# Patient Record
Sex: Female | Born: 1960 | Race: White | Hispanic: No | Marital: Married | Smoking: Former smoker
Health system: Southern US, Community
[De-identification: ages and names within clinical notes are randomized; demographics above are authoritative.]

## PROBLEM LIST (undated history)

## (undated) DIAGNOSIS — R011 Cardiac murmur, unspecified: Secondary | ICD-10-CM

## (undated) DIAGNOSIS — R51 Headache: Secondary | ICD-10-CM

## (undated) DIAGNOSIS — H3530 Unspecified macular degeneration: Secondary | ICD-10-CM

## (undated) DIAGNOSIS — I10 Essential (primary) hypertension: Secondary | ICD-10-CM

## (undated) DIAGNOSIS — R519 Headache, unspecified: Secondary | ICD-10-CM

## (undated) DIAGNOSIS — F419 Anxiety disorder, unspecified: Secondary | ICD-10-CM

## (undated) DIAGNOSIS — E119 Type 2 diabetes mellitus without complications: Secondary | ICD-10-CM

## (undated) HISTORY — DX: Unspecified macular degeneration: H35.30

## (undated) HISTORY — PX: OOPHORECTOMY: SHX86

---

## 2008-08-10 HISTORY — PX: ABDOMINAL HYSTERECTOMY: SHX81

## 2008-10-24 ENCOUNTER — Ambulatory Visit: Payer: Self-pay | Admitting: Family Medicine

## 2008-12-21 ENCOUNTER — Ambulatory Visit: Payer: Self-pay | Admitting: Cardiology

## 2008-12-21 ENCOUNTER — Ambulatory Visit: Payer: Self-pay | Admitting: Obstetrics & Gynecology

## 2008-12-27 ENCOUNTER — Ambulatory Visit: Payer: Self-pay | Admitting: Obstetrics & Gynecology

## 2008-12-29 ENCOUNTER — Ambulatory Visit: Payer: Self-pay | Admitting: Internal Medicine

## 2009-01-08 ENCOUNTER — Ambulatory Visit: Payer: Self-pay | Admitting: Gynecologic Oncology

## 2009-01-22 ENCOUNTER — Ambulatory Visit: Payer: Self-pay | Admitting: Gynecologic Oncology

## 2012-12-02 ENCOUNTER — Ambulatory Visit: Payer: Self-pay | Admitting: Family Medicine

## 2016-06-03 ENCOUNTER — Encounter (INDEPENDENT_AMBULATORY_CARE_PROVIDER_SITE_OTHER): Payer: Self-pay

## 2016-06-03 ENCOUNTER — Ambulatory Visit: Payer: Self-pay | Attending: Oncology

## 2016-06-03 ENCOUNTER — Ambulatory Visit
Admission: RE | Admit: 2016-06-03 | Discharge: 2016-06-03 | Disposition: A | Discharge status: Home / Self Care | Payer: Self-pay | Source: Ambulatory Visit | Attending: Oncology | Admitting: Oncology

## 2016-06-03 VITALS — BP 188/100 | HR 67 | Temp 98.1°F | Ht 67.91 in | Wt 227.0 lb

## 2016-06-03 DIAGNOSIS — Z0000 Encounter for general adult medical examination without abnormal findings: Secondary | ICD-10-CM

## 2016-06-03 NOTE — Progress Notes (Signed)
Subjective:     Patient ID: Diane Logan, female   DOB: Mar 12, 1961, 55 y.o.   MRN: [id number]  HPI   Review of Systems     Objective:   Physical Exam  Pulmonary/Chest: Right breast exhibits no inverted nipple, no mass, no nipple discharge, no skin change and no tenderness. Left breast exhibits no inverted nipple, no mass, no nipple discharge, no skin change and no tenderness. Breasts are symmetrical.       Assessment:  55 year old patient presents for Diane Logan clinic visit.  Patient screened, and meets BCCCP eligibility.  Patient does not have insurance, Medicare or Medicaid.  Handout given on Affordable Care Act.  Instructed patient on breast self-exam using teach back method.  CBE unremarkable.  No mass or lump palpated.  Patient reports she had a "complete hysterectomy".  States she gets her blood pressure medication from Diane Logan, and has no problem paying for it, just forgot.     Plan:     Sent for bilateral screening mammogram.

## 2016-06-04 NOTE — Progress Notes (Signed)
Letter mailed from [facility] to notify of normal mammogram results.  Patient to return in one year for annual screening.  Copy to [facility]. 

## 2016-12-04 DIAGNOSIS — N3281 Overactive bladder: Secondary | ICD-10-CM | POA: Insufficient documentation

## 2016-12-14 DIAGNOSIS — R0683 Snoring: Secondary | ICD-10-CM | POA: Insufficient documentation

## 2017-07-17 ENCOUNTER — Other Ambulatory Visit: Payer: Self-pay

## 2017-07-17 ENCOUNTER — Ambulatory Visit
Admission: EM | Admit: 2017-07-17 | Discharge: 2017-07-17 | Disposition: A | Discharge status: Home / Self Care | Payer: 59 | Attending: Emergency Medicine | Admitting: Emergency Medicine

## 2017-07-17 DIAGNOSIS — R059 Cough, unspecified: Secondary | ICD-10-CM

## 2017-07-17 DIAGNOSIS — J019 Acute sinusitis, unspecified: Secondary | ICD-10-CM

## 2017-07-17 DIAGNOSIS — R05 Cough: Secondary | ICD-10-CM

## 2017-07-17 HISTORY — DX: Essential (primary) hypertension: I10

## 2017-07-17 HISTORY — DX: Type 2 diabetes mellitus without complications: E11.9

## 2017-07-17 HISTORY — DX: Anxiety disorder, unspecified: F41.9

## 2017-07-17 MED ORDER — IPRATROPIUM BROMIDE 0.06 % NA SOLN: 2.0000 | Freq: Four times a day (QID) | NASAL | 0 refills | Status: DC

## 2017-07-17 MED ORDER — HYDROCOD POLST-CPM POLST ER 10-8 MG/5ML PO SUER: 5.0000 mL | Freq: Two times a day (BID) | ORAL | 0 refills | Status: DC | PRN

## 2017-07-17 MED ORDER — FLUTICASONE PROPIONATE 50 MCG/ACT NA SUSP: 2.0000 | Freq: Every day | NASAL | 0 refills | Status: DC

## 2017-07-17 MED ORDER — BENZONATATE 200 MG PO CAPS: 200.0000 mg | ORAL_CAPSULE | Freq: Three times a day (TID) | ORAL | 0 refills | Status: DC | PRN

## 2017-07-17 NOTE — ED Triage Notes (Signed)
Pt with cough and congestion x past 3 days. Unsure if she's had fever. Headache pain "from coughing so much."

## 2017-07-17 NOTE — ED Provider Notes (Signed)
HPI  SUBJECTIVE:  Diane Logan is a 56 y.o. female who presents with congestion, rhinorrhea, postnasal drip for the past several days.  She reports a dry, nonproductive cough and 3 episodes of posttussive emesis.  Reports headache secondary to the cough.  She states that both her ears feel stopped up.  She denies change in her hearing or ear pain.  States that she cannot sleep at night secondary to the cough.  She denies sinus pain or pressure, fevers, upper dental pain.  No facial swelling.  No wheezing, chest pain, shortness of breath.  No acid reflux symptoms.  She has allergies, but states that they are not bothering her now.  No sick contacts.  No antibiotics in the past month.  She took Excedrin within 6-8 hours of valuation.  She has also tried Mucinex with improvement in her symptoms.  Symptoms worse with coughing.  She has a past medical history of allergies, diabetes, hypertension, sinusitis which frequently causes a cough.  No history of asthma, emphysema, COPD, smoking, GERD.  [id number], Edmonia Lynch, MD .  Past Medical History:  Diagnosis Date  . Anxiety   . Diabetes mellitus without complication ([place])   . Hypertension     Past Surgical History:  Procedure Laterality Date  . ABDOMINAL HYSTERECTOMY      Family History  Adopted: Yes    Social History   Tobacco Use  . Smoking status: Former Research scientist (life sciences)  . Smokeless tobacco: Never Used  Substance Use Topics  . Alcohol use: Yes    Comment: monthly  . Drug use: No    No current facility-administered medications for this encounter.   Current Outpatient Medications:  .  lisinopril (PRINIVIL,ZESTRIL) 10 MG tablet, Take 10 mg by mouth daily., Disp: , Rfl:  .  metFORMIN (GLUCOPHAGE) 1000 MG tablet, Take 1,000 mg by mouth 2 (two) times daily with a meal., Disp: , Rfl:  .  PARoxetine (PAXIL) 40 MG tablet, Take 40 mg by mouth every morning., Disp: , Rfl:  .  benzonatate (TESSALON) 200 MG capsule, Take 1 capsule (200 mg total) by mouth 3  (three) times daily as needed for cough., Disp: 30 capsule, Rfl: 0 .  chlorpheniramine-HYDROcodone (TUSSIONEX PENNKINETIC ER) 10-8 MG/5ML SUER, Take 5 mLs by mouth every 12 (twelve) hours as needed for cough., Disp: 120 mL, Rfl: 0 .  fluticasone (FLONASE) 50 MCG/ACT nasal spray, Place 2 sprays into both nostrils daily., Disp: 16 g, Rfl: 0 .  ipratropium (ATROVENT) 0.06 % nasal spray, Place 2 sprays into both nostrils 4 (four) times daily. 3-4 times/ day, Disp: 15 mL, Rfl: 0  No Known Allergies   ROS  As noted in HPI.   Physical Exam  BP (!) 136/91 (BP Location: Left Arm)   Pulse 80   Temp 97.9 F (36.6 C)   Resp 18   Ht 5' 6.5" (1.689 m)   Wt 217 lb (98.4 kg)   LMP 06/03/2009 (Approximate)   SpO2 100%   BMI 34.50 kg/m   Constitutional: Well developed, well nourished, no acute distress Eyes:  EOMI, conjunctiva normal bilaterally HENT: Normocephalic, atraumatic,mucus membranes moist.  Right TM normal.  Left TM obscured with cerumen.  Positive purulent mucoid nasal congestion no maxillary or frontal sinus tenderness.  Positive postnasal drip.   Respiratory: Normal inspiratory effort, lungs clear bilaterally, good air movement Cardiovascular: Normal rate and rhythm no murmurs rubs or gallops  GI: nondistended skin: No rash, skin intact Musculoskeletal: no deformities Neurologic: Alert & oriented x 3,  no focal neuro deficits Psychiatric: Speech and behavior appropriate   ED Course   Medications - No data to display  No orders of the defined types were placed in this encounter.   No results found for this or any previous visit (from the past 24 hour(s)). No results found.  ED Clinical Impression  Cough  Acute sinusitis, recurrence not specified, unspecified location   ED Assessment/Plan  [place] Narcotic database reviewed for this patient, and feel that the risk/benefit ratio today is favorable for proceeding with a prescription for controlled substance.  1 cough syrup  prescription in 2017.  Presentation consistent with an acute sinusitis, most likely viral,, feel that the cough is from postnasal drip.  Doubt pneumonia.  She has no clear indications for antibiotics at this time, and patient is okay with not starting antibiotics today.  We will send home with Tussionex, Tessalon.  She will start Flonase or Atrovent, which ever one works better, she will continue Mucinex and start some saline nasal irrigation.  Follow-up with PMD or here if not better after 10 days of being sick, double sickening, getting worse.  Discussed MDM, plan and followup with patient.patient agrees with plan.   Meds ordered this encounter  Medications  . ipratropium (ATROVENT) 0.06 % nasal spray    Sig: Place 2 sprays into both nostrils 4 (four) times daily. 3-4 times/ day    Dispense:  15 mL    Refill:  0  . fluticasone (FLONASE) 50 MCG/ACT nasal spray    Sig: Place 2 sprays into both nostrils daily.    Dispense:  16 g    Refill:  0  . chlorpheniramine-HYDROcodone (TUSSIONEX PENNKINETIC ER) 10-8 MG/5ML SUER    Sig: Take 5 mLs by mouth every 12 (twelve) hours as needed for cough.    Dispense:  120 mL    Refill:  0  . benzonatate (TESSALON) 200 MG capsule    Sig: Take 1 capsule (200 mg total) by mouth 3 (three) times daily as needed for cough.    Dispense:  30 capsule    Refill:  0    *This clinic note was created using [employer]. Therefore, there may be occasional mistakes despite careful proofreading.   ?   Melynda Ripple, MD 07/17/17 [phone]

## 2017-07-17 NOTE — Discharge Instructions (Signed)
Take the medication as written. Return to the ED or here if you get worse, have a fever >100.4, or for any concerns. You may take 600 mg of motrin with 1 gram of tylenol up to 3-4 times a day as needed for pain. This is an effective combination for pain.  Most sinus infections are viral and do not need antibiotics unless you have a high fever, have had this for 10 days, or you get better and then get sick again. Use a neti pot or the NeilMed sinus rinse as often as you want to to reduce nasal congestion. Follow the directions on the box.   Go to www.goodrx.com to look up your medications. This will give you a list of where you can find your prescriptions at the most affordable prices. Or you can ask the pharmacist what the cash price is. This is frequently cheaper than going through insurance.

## 2017-07-23 ENCOUNTER — Encounter: Payer: Self-pay | Admitting: *Deleted

## 2017-09-17 ENCOUNTER — Other Ambulatory Visit: Payer: Self-pay | Admitting: Podiatry

## 2017-09-17 ENCOUNTER — Encounter: Payer: Self-pay | Admitting: Podiatry

## 2017-09-17 ENCOUNTER — Other Ambulatory Visit: Payer: Self-pay

## 2017-09-17 ENCOUNTER — Ambulatory Visit (INDEPENDENT_AMBULATORY_CARE_PROVIDER_SITE_OTHER): Payer: 59

## 2017-09-17 ENCOUNTER — Ambulatory Visit: Payer: 59 | Admitting: Podiatry

## 2017-09-17 DIAGNOSIS — M722 Plantar fascial fibromatosis: Secondary | ICD-10-CM | POA: Diagnosis not present

## 2017-09-17 DIAGNOSIS — F419 Anxiety disorder, unspecified: Secondary | ICD-10-CM | POA: Insufficient documentation

## 2017-09-17 DIAGNOSIS — I10 Essential (primary) hypertension: Secondary | ICD-10-CM | POA: Insufficient documentation

## 2017-09-17 DIAGNOSIS — R52 Pain, unspecified: Secondary | ICD-10-CM

## 2017-09-17 DIAGNOSIS — E785 Hyperlipidemia, unspecified: Secondary | ICD-10-CM | POA: Insufficient documentation

## 2017-09-17 DIAGNOSIS — E119 Type 2 diabetes mellitus without complications: Secondary | ICD-10-CM | POA: Insufficient documentation

## 2017-09-17 MED ORDER — DICLOFENAC SODIUM 75 MG PO TBEC: 75.0000 mg | DELAYED_RELEASE_TABLET | Freq: Two times a day (BID) | ORAL | 1 refills | Status: DC

## 2017-09-17 NOTE — Progress Notes (Signed)
   Subjective:    Patient ID: Diane Logan, female    DOB: March 21, 1961, 57 y.o.   MRN: [id number]  HPI    Review of Systems  All other systems reviewed and are negative.      Objective:   Physical Exam        Assessment & Plan:

## 2017-09-20 NOTE — Progress Notes (Signed)
   Subjective: 57 year old female with PMHx of T2DM presents today with a chief complaint of pain and tenderness in the plantar aspect of the left heel that began three weeks ago. She states it feels as if she is walking on a rock. Patient states that it hurts in the mornings with the first steps out of bed. She has been taking Ibuprofen with some relief. Patient presents today for further treatment and evaluation.   Past Medical History:  Diagnosis Date  . Anxiety   . Diabetes mellitus without complication ([place])   . Hypertension      Objective: Physical Exam General: The patient is alert and oriented x3 in no acute distress.  Dermatology: Skin is warm, dry and supple bilateral lower extremities. Negative for open lesions or macerations bilateral.   Vascular: Dorsalis Pedis and Posterior Tibial pulses palpable bilateral.  Capillary fill time is immediate to all digits.  Neurological: Epicritic and protective threshold intact bilateral.   Musculoskeletal: Tenderness to palpation at the medial calcaneal tubercale and through the insertion of the plantar fascia of the left foot. All other joints range of motion within normal limits bilateral. Strength 5/5 in all groups bilateral.   Radiographic exam:   Normal osseous mineralization. Joint spaces preserved. No fracture/dislocation/boney destruction. Calcaneal spur present with mild thickening of plantar fascia left. No other soft tissue abnormalities or radiopaque foreign bodies.   Assessment: 1. Plantar fasciitis left foot  Plan of Care:  1. Patient evaluated. Xrays reviewed.   2. Injection of 0.5cc Celestone soluspan injected into the left plantar fascia.  3. Rx for Diclofenac 75mg  PO BID ordered for patient. 4. Plantar fascial band(s) dispensed  5. Instructed patient regarding therapies and modalities at home to alleviate symptoms.  6. Return to clinic in 4 weeks.    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    Edrick Kins, DPM [facility]  Dr. Edrick Kins, DPM    [address], [place] [address]                Office [phone]  Fax [phone]

## 2017-10-15 ENCOUNTER — Ambulatory Visit: Payer: 59 | Admitting: Podiatry

## 2018-04-04 ENCOUNTER — Other Ambulatory Visit: Payer: Self-pay | Admitting: Physician Assistant

## 2018-04-04 DIAGNOSIS — Z1231 Encounter for screening mammogram for malignant neoplasm of breast: Secondary | ICD-10-CM

## 2018-04-25 ENCOUNTER — Encounter: Payer: Self-pay | Admitting: *Deleted

## 2018-04-27 ENCOUNTER — Ambulatory Visit
Admission: RE | Admit: 2018-04-27 | Discharge: 2018-04-27 | Disposition: A | Discharge status: Home / Self Care | Payer: 59 | Source: Ambulatory Visit | Attending: Physician Assistant | Admitting: Physician Assistant

## 2018-04-27 DIAGNOSIS — Z1231 Encounter for screening mammogram for malignant neoplasm of breast: Secondary | ICD-10-CM

## 2018-05-04 ENCOUNTER — Other Ambulatory Visit: Payer: Self-pay

## 2018-05-04 DIAGNOSIS — Z1211 Encounter for screening for malignant neoplasm of colon: Secondary | ICD-10-CM

## 2018-05-12 ENCOUNTER — Encounter: Payer: Self-pay | Admitting: *Deleted

## 2018-05-12 ENCOUNTER — Other Ambulatory Visit: Payer: Self-pay

## 2018-05-17 NOTE — Discharge Instructions (Signed)
General Anesthesia, Adult, Care After °These instructions provide you with information about caring for yourself after your procedure. Your health care provider may also give you more specific instructions. Your treatment has been planned according to current medical practices, but problems sometimes occur. Call your health care provider if you have any problems or questions after your procedure. °What can I expect after the procedure? °After the procedure, it is common to have: °· Vomiting. °· A sore throat. °· Mental slowness. ° °It is common to feel: °· Nauseous. °· Cold or shivery. °· Sleepy. °· Tired. °· Sore or achy, even in parts of your body where you did not have surgery. ° °Follow these instructions at home: °For at least 24 hours after the procedure: °· Do not: °? Participate in activities where you could fall or become injured. °? Drive. °? Use heavy machinery. °? Drink alcohol. °? Take sleeping pills or medicines that cause drowsiness. °? Make important decisions or sign legal documents. °? Take care of children on your own. °· Rest. °Eating and drinking °· If you vomit, drink water, juice, or soup when you can drink without vomiting. °· Drink enough fluid to keep your urine clear or pale yellow. °· Make sure you have little or no nausea before eating solid foods. °· Follow the diet recommended by your health care provider. °General instructions °· Have a responsible adult stay with you until you are awake and alert. °· Return to your normal activities as told by your health care provider. Ask your health care provider what activities are safe for you. °· Take over-the-counter and prescription medicines only as told by your health care provider. °· If you smoke, do not smoke without supervision. °· Keep all follow-up visits as told by your health care provider. This is important. °Contact a health care provider if: °· You continue to have nausea or vomiting at home, and medicines are not helpful. °· You  cannot drink fluids or start eating again. °· You cannot urinate after 8-12 hours. °· You develop a skin rash. °· You have fever. °· You have increasing redness at the site of your procedure. °Get help right away if: °· You have difficulty breathing. °· You have chest pain. °· You have unexpected bleeding. °· You feel that you are having a life-threatening or urgent problem. °This information is not intended to replace advice given to you by your health care provider. Make sure you discuss any questions you have with your health care provider. °Document Released: 11/02/2000 Document Revised: 12/30/2015 Document Reviewed: 07/11/2015 °Elsevier Interactive Patient Education © 2018 Elsevier Inc. ° °

## 2018-05-18 ENCOUNTER — Ambulatory Visit
Admission: RE | Admit: 2018-05-18 | Discharge: 2018-05-18 | Disposition: A | Discharge status: Home / Self Care | Payer: No Typology Code available for payment source | Source: Ambulatory Visit | Attending: Gastroenterology | Admitting: Gastroenterology

## 2018-05-18 ENCOUNTER — Ambulatory Visit: Payer: No Typology Code available for payment source | Admitting: Anesthesiology

## 2018-05-18 ENCOUNTER — Encounter
Admission: RE | Disposition: A | Discharge status: Home / Self Care | Payer: Self-pay | Source: Ambulatory Visit | Attending: Gastroenterology

## 2018-05-18 DIAGNOSIS — F419 Anxiety disorder, unspecified: Secondary | ICD-10-CM | POA: Diagnosis not present

## 2018-05-18 DIAGNOSIS — Z87891 Personal history of nicotine dependence: Secondary | ICD-10-CM | POA: Diagnosis not present

## 2018-05-18 DIAGNOSIS — Z79899 Other long term (current) drug therapy: Secondary | ICD-10-CM | POA: Insufficient documentation

## 2018-05-18 DIAGNOSIS — Z7984 Long term (current) use of oral hypoglycemic drugs: Secondary | ICD-10-CM | POA: Diagnosis not present

## 2018-05-18 DIAGNOSIS — K644 Residual hemorrhoidal skin tags: Secondary | ICD-10-CM | POA: Insufficient documentation

## 2018-05-18 DIAGNOSIS — E119 Type 2 diabetes mellitus without complications: Secondary | ICD-10-CM | POA: Insufficient documentation

## 2018-05-18 DIAGNOSIS — K621 Rectal polyp: Secondary | ICD-10-CM

## 2018-05-18 DIAGNOSIS — D120 Benign neoplasm of cecum: Secondary | ICD-10-CM | POA: Insufficient documentation

## 2018-05-18 DIAGNOSIS — D128 Benign neoplasm of rectum: Secondary | ICD-10-CM | POA: Diagnosis not present

## 2018-05-18 DIAGNOSIS — I10 Essential (primary) hypertension: Secondary | ICD-10-CM | POA: Insufficient documentation

## 2018-05-18 DIAGNOSIS — Z1211 Encounter for screening for malignant neoplasm of colon: Secondary | ICD-10-CM | POA: Insufficient documentation

## 2018-05-18 HISTORY — DX: Cardiac murmur, unspecified: R01.1

## 2018-05-18 HISTORY — DX: Headache: R51

## 2018-05-18 HISTORY — PX: COLONOSCOPY WITH PROPOFOL: SHX5780

## 2018-05-18 HISTORY — DX: Headache, unspecified: R51.9

## 2018-05-18 HISTORY — PX: POLYPECTOMY: SHX5525

## 2018-05-18 LAB — GLUCOSE, CAPILLARY
GLUCOSE-CAPILLARY: 128 mg/dL — AB (ref 70–99)
Glucose-Capillary: 122 mg/dL — ABNORMAL HIGH (ref 70–99)

## 2018-05-18 SURGERY — COLONOSCOPY WITH PROPOFOL
Anesthesia: General | Site: Rectum

## 2018-05-18 MED ORDER — LACTATED RINGERS IV SOLN
INTRAVENOUS | Status: DC
  Administered 2018-05-18: 07:00:00 via INTRAVENOUS

## 2018-05-18 MED ORDER — PROPOFOL 10 MG/ML IV BOLUS
INTRAVENOUS | Status: DC | PRN
  Administered 2018-05-18 (×3): 30 mg via INTRAVENOUS
  Administered 2018-05-18: 20 mg via INTRAVENOUS
  Administered 2018-05-18 (×2): 40 mg via INTRAVENOUS
  Administered 2018-05-18: 120 mg via INTRAVENOUS
  Administered 2018-05-18: 30 mg via INTRAVENOUS
  Administered 2018-05-18: 20 mg via INTRAVENOUS

## 2018-05-18 MED ORDER — SODIUM CHLORIDE 0.9 % IV SOLN: INTRAVENOUS | Status: DC

## 2018-05-18 MED ORDER — STERILE WATER FOR IRRIGATION IR SOLN
Status: DC | PRN
  Administered 2018-05-18: 09:00:00

## 2018-05-18 MED ORDER — LIDOCAINE HCL (CARDIAC) PF 100 MG/5ML IV SOSY
PREFILLED_SYRINGE | INTRAVENOUS | Status: DC | PRN
  Administered 2018-05-18: 40 mg via INTRAVENOUS

## 2018-05-18 SURGICAL SUPPLY — 11 items
CANISTER SUCT 1200ML W/VALVE (MISCELLANEOUS) ×2 IMPLANT
COLOWRAP LRG (MISCELLANEOUS) ×2
COMPRESSION COLOWRAP LRG (MISCELLANEOUS) ×1 IMPLANT
ELECT REM PT RETURN 9FT ADLT (ELECTROSURGICAL) ×2
ELECTRODE REM PT RTRN 9FT ADLT (ELECTROSURGICAL) ×1 IMPLANT
GOWN CVR UNV OPN BCK APRN NK (MISCELLANEOUS) ×2 IMPLANT
GOWN ISOL THUMB LOOP REG UNIV (MISCELLANEOUS) ×2
KIT ENDO PROCEDURE OLY (KITS) ×2 IMPLANT
SNARE SHORT THROW 13M SML OVAL (MISCELLANEOUS) ×2 IMPLANT
TRAP ETRAP POLY (MISCELLANEOUS) ×2 IMPLANT
WATER STERILE IRR 250ML POUR (IV SOLUTION) ×2 IMPLANT

## 2018-05-18 NOTE — Op Note (Signed)
[facility] Gastroenterology Patient Name: Diane Logan Procedure Date: 05/18/2018 7:55 AM MRN: [id number] Account #: [id number] Date of Birth: 11-05-1960 Admit Type: Outpatient Age: 57 Room: [facility] OR ROOM 01 Gender: Female Note Status: Finalized Procedure:            Colonoscopy Indications:          Screening for colorectal malignant neoplasm, This is                        the patient's first colonoscopy Providers:            Lin Landsman MD, MD Referring MD:         Edmonia Lynch. Aycock MD (Referring MD) Medicines:            Monitored Anesthesia Care Complications:        No immediate complications. Estimated blood loss: None. Procedure:            Pre-Anesthesia Assessment:                       - Prior to the procedure, a History and Physical was                        performed, and patient medications and allergies were                        reviewed. The patient is competent. The risks and                        benefits of the procedure and the sedation options and                        risks were discussed with the patient. All questions                        were answered and informed consent was obtained.                        Patient identification and proposed procedure were                        verified by the physician, the nurse, the                        anesthesiologist, the anesthetist and the technician in                        the pre-procedure area in the procedure room in the                        endoscopy suite. Mental Status Examination: alert and                        oriented. Airway Examination: normal oropharyngeal                        airway and neck mobility. Respiratory Examination:                        clear to auscultation. CV Examination: normal.  Prophylactic Antibiotics: The patient does not require                        prophylactic antibiotics. Prior Anticoagulants: The         patient has taken no previous anticoagulant or                        antiplatelet agents. ASA Grade Assessment: II - A                        patient with mild systemic disease. After reviewing the                        risks and benefits, the patient was deemed in                        satisfactory condition to undergo the procedure. The                        anesthesia plan was to use monitored anesthesia care                        (MAC). Immediately prior to administration of                        medications, the patient was re-assessed for adequacy                        to receive sedatives. The heart rate, respiratory rate,                        oxygen saturations, blood pressure, adequacy of                        pulmonary ventilation, and response to care were                        monitored throughout the procedure. The physical status                        of the patient was re-assessed after the procedure.                       After obtaining informed consent, the colonoscope was                        passed under direct vision. Throughout the procedure,                        the patient's blood pressure, pulse, and oxygen                        saturations were monitored continuously. The was                        introduced through the anus and advanced to the the                        cecum, identified by appendiceal orifice and ileocecal  valve. The colonoscopy was performed without                        difficulty. The patient tolerated the procedure well.                        The quality of the bowel preparation was evaluated                        using the BBPS [facility] Bowel Preparation Scale) with                        scores of: Right Colon = 3, Transverse Colon = 3 and                        Left Colon = 3 (entire mucosa seen well with no                        residual staining, small fragments of stool or opaque                         liquid). The total BBPS score equals 9. Findings:      Skin tags were found on perianal exam.      Three sessile polyps were found in the rectum and cecum. The polyps were       4 to 6 mm in size. These polyps were removed with a hot snare. Resection       and retrieval were complete.      The retroflexed view of the distal rectum and anal verge was normal and       showed no anal or rectal abnormalities. Impression:           - Perianal skin tags found on perianal exam.                       - Three 4 to 6 mm polyps in the rectum (1) and in the                        cecum (2), removed with cold snare and hot snare.                        Resected and retrieved.                       - The distal rectum and anal verge are normal on                        retroflexion view. Recommendation:       - Discharge patient to home (with escort).                       - Diabetic (ADA) diet, low fat diet and low sodium diet.                       - Continue present medications.                       - Await pathology results.                       -  Repeat colonoscopy in 3 years for surveillance of                        multiple polyps. Procedure Code(s):    --- Professional ---                       [phone], Colonoscopy, flexible; with removal of tumor(s),                        polyp(s), or other lesion(s) by snare technique Diagnosis Code(s):    --- Professional ---                       Z12.11, Encounter for screening for malignant neoplasm                        of colon                       K62.1, Rectal polyp                       D12.0, Benign neoplasm of cecum                       K64.4, Residual hemorrhoidal skin tags CPT copyright 2018 American Medical Association. All rights reserved. The codes documented in this report are preliminary and upon coder review may  be revised to meet current compliance requirements. Dr. Ulyess Mort Lin Landsman MD, MD 05/18/2018  8:51:35 AM This report has been signed electronically. Number of Addenda: 0 Note Initiated On: 05/18/2018 7:55 AM Scope Withdrawal Time: 0 hours 15 minutes 33 seconds  Total Procedure Duration: 0 hours 18 minutes 0 seconds       [facility]

## 2018-05-18 NOTE — Anesthesia Procedure Notes (Signed)
Performed by: Jaisa Defino, CRNA Pre-anesthesia Checklist: Patient identified, Emergency Drugs available, Suction available, Timeout performed and Patient being monitored Patient Re-evaluated:Patient Re-evaluated prior to induction Oxygen Delivery Method: Nasal cannula Placement Confirmation: positive ETCO2       

## 2018-05-18 NOTE — H&P (Signed)
Cephas Darby, MD [address]  [place]  [place], [place] [address]  Main: [phone]  Fax: [phone] Pager: [phone]  Primary Care Physician:  Donnie Coffin, MD Primary Gastroenterologist:  Dr. Cephas Darby  Pre-Procedure History & Physical: HPI:  Diane Logan is a 57 y.o. female is here for an colonoscopy.   Past Medical History:  Diagnosis Date  . Anxiety   . Diabetes mellitus without complication ([place])   . Headache    stress - several times per week  . Heart murmur   . Hypertension     Past Surgical History:  Procedure Laterality Date  . ABDOMINAL HYSTERECTOMY  2010  . OOPHORECTOMY      Prior to Admission medications   Medication Sig Start Date End Date Taking? Authorizing Provider  amLODipine (NORVASC) 5 MG tablet Take 5 mg by mouth.   Yes [provider]  lisinopril (PRINIVIL,ZESTRIL) 40 MG tablet TAKE 1 TABLET BY MOUTH EVERY DAY 08/27/11  Yes [provider]  metFORMIN (GLUCOPHAGE) 500 MG tablet Take 500 mg by mouth.   Yes [provider]  Multiple Vitamin (MULTIVITAMIN) tablet Take 1 tablet by mouth daily.   Yes [provider]  NIACIN PO Take by mouth daily.   Yes [provider]  Omega-3 Fatty Acids (FISH OIL PO) Take by mouth daily.   Yes [provider]  PARoxetine (PAXIL) 40 MG tablet Take 40 mg by mouth.   Yes [provider]  diclofenac (VOLTAREN) 75 MG EC tablet Take 1 tablet (75 mg total) by mouth 2 (two) times daily. Patient not taking: Reported on 05/12/2018 09/17/17   Edrick Kins, DPM    Allergies as of 05/04/2018  . (No Known Allergies)    Family History  Adopted: Yes    Social History   Socioeconomic History  . Marital status: Single    Spouse name: Not on file  . Number of children: Not on file  . Years of education: Not on file  . Highest education level: Not on file  Occupational History  . Not on file  Social Needs  . Financial resource strain: Not  on file  . Food insecurity:    Worry: Not on file    Inability: Not on file  . Transportation needs:    Medical: Not on file    Non-medical: Not on file  Tobacco Use  . Smoking status: Former Research scientist (life sciences)  . Smokeless tobacco: Never Used  . Tobacco comment: socially in 12s  Substance and Sexual Activity  . Alcohol use: Yes    Comment: monthly  . Drug use: No  . Sexual activity: Not on file  Lifestyle  . Physical activity:    Days per week: Not on file    Minutes per session: Not on file  . Stress: Not on file  Relationships  . Social connections:    Talks on phone: Not on file    Gets together: Not on file    Attends religious service: Not on file    Active member of club or organization: Not on file    Attends meetings of clubs or organizations: Not on file    Relationship status: Not on file  . Intimate partner violence:    Fear of current or ex partner: Not on file    Emotionally abused: Not on file    Physically abused: Not on file    Forced sexual activity: Not on file  Other Topics Concern  .  Not on file  Social History Narrative  . Not on file    Review of Systems: See HPI, otherwise negative ROS  Physical Exam: BP (!) 161/80   Pulse (!) 58   Temp (!) 97.5 F (36.4 C) (Temporal)   Resp 16   Ht 5' 6.5" (1.689 m)   Wt 99.3 kg   LMP 06/03/2009 (Approximate)   SpO2 98%   BMI 34.82 kg/m  General:   Alert,  pleasant and cooperative in NAD Head:  Normocephalic and atraumatic. Neck:  Supple; no masses or thyromegaly. Lungs:  Clear throughout to auscultation.    Heart:  Regular rate and rhythm. Abdomen:  Soft, nontender and nondistended. Normal bowel sounds, without guarding, and without rebound.   Neurologic:  Alert and  oriented x4;  grossly normal neurologically.  Impression/Plan: Diane Logan is here for an colonoscopy to be performed for colon cancer screening  Risks, benefits, limitations, and alternatives regarding  colonoscopy have been reviewed with  the patient.  Questions have been answered.  All parties agreeable.   Sherri Sear, MD  05/18/2018, 8:12 AM

## 2018-05-18 NOTE — Anesthesia Preprocedure Evaluation (Addendum)
Anesthesia Evaluation  Patient identified by MRN, date of birth, ID band Patient awake    Reviewed: Allergy & Precautions, H&P , NPO status , Patient's Chart, lab work & pertinent test results  Airway Mallampati: II  TM Distance: >3 FB Neck ROM: full    Dental no notable dental hx.    Pulmonary former smoker,    Pulmonary exam normal breath sounds clear to auscultation       Cardiovascular hypertension, On Medications Normal cardiovascular exam     Neuro/Psych    GI/Hepatic   Endo/Other  diabetes, Well Controlled, Type 2  Renal/GU      Musculoskeletal   Abdominal   Peds  Hematology   Anesthesia Other Findings   Reproductive/Obstetrics                            Anesthesia Physical Anesthesia Plan  ASA: II  Anesthesia Plan: General   Post-op Pain Management:    Induction:   PONV Risk Score and Plan:   Airway Management Planned:   Additional Equipment:   Intra-op Plan:   Post-operative Plan:   Informed Consent: I have reviewed the patients History and Physical, chart, labs and discussed the procedure including the risks, benefits and alternatives for the proposed anesthesia with the patient or authorized representative who has indicated his/her understanding and acceptance.     Plan Discussed with:   Anesthesia Plan Comments:         Anesthesia Quick Evaluation

## 2018-05-18 NOTE — Anesthesia Postprocedure Evaluation (Signed)
Anesthesia Post Note  Patient: Diane Logan  Procedure(s) Performed: COLONOSCOPY WITH PROPOFOL (N/A Rectum) POLYPECTOMY (N/A Rectum)  Patient location during evaluation: PACU Anesthesia Type: General Level of consciousness: awake and alert Pain management: pain level controlled Vital Signs Assessment: post-procedure vital signs reviewed and stable Respiratory status: spontaneous breathing Cardiovascular status: blood pressure returned to baseline Anesthetic complications: no    Jaci Standard, III,  Jaqualyn Juday D

## 2018-05-18 NOTE — Transfer of Care (Signed)
Immediate Anesthesia Transfer of Care Note  Patient: Diane Logan  Procedure(s) Performed: COLONOSCOPY WITH PROPOFOL (N/A Rectum) POLYPECTOMY (N/A Rectum)  Patient Location: PACU  Anesthesia Type: General  Level of Consciousness: awake, alert  and patient cooperative  Airway and Oxygen Therapy: Patient Spontanous Breathing and Patient connected to supplemental oxygen  Post-op Assessment: Post-op Vital signs reviewed, Patient's Cardiovascular Status Stable, Respiratory Function Stable, Patent Airway and No signs of Nausea or vomiting  Post-op Vital Signs: Reviewed and stable  Complications: No apparent anesthesia complications

## 2018-05-19 ENCOUNTER — Encounter: Payer: Self-pay | Admitting: Gastroenterology

## 2018-05-20 ENCOUNTER — Encounter: Payer: Self-pay | Admitting: Gastroenterology

## 2018-05-26 ENCOUNTER — Encounter: Payer: Self-pay | Admitting: Gastroenterology

## 2018-08-22 ENCOUNTER — Ambulatory Visit (INDEPENDENT_AMBULATORY_CARE_PROVIDER_SITE_OTHER): Payer: 59 | Admitting: Internal Medicine

## 2018-08-22 ENCOUNTER — Encounter: Payer: Self-pay | Admitting: Internal Medicine

## 2018-08-22 ENCOUNTER — Encounter

## 2018-08-22 VITALS — BP 122/70 | HR 58 | Ht 66.0 in | Wt 227.0 lb

## 2018-08-22 DIAGNOSIS — E785 Hyperlipidemia, unspecified: Secondary | ICD-10-CM

## 2018-08-22 DIAGNOSIS — R011 Cardiac murmur, unspecified: Secondary | ICD-10-CM

## 2018-08-22 DIAGNOSIS — E119 Type 2 diabetes mellitus without complications: Secondary | ICD-10-CM

## 2018-08-22 DIAGNOSIS — I4510 Unspecified right bundle-branch block: Secondary | ICD-10-CM

## 2018-08-22 NOTE — Patient Instructions (Signed)
Medication Instructions:  Your physician has recommended you make the following change in your medication:  1- STOP Niacin.   If you need a refill on your cardiac medications before your next appointment, please call your pharmacy.   Lab work: none If you have labs (blood work) drawn today and your tests are completely normal, you will receive your results only by: [name] MyChart Message (if you have MyChart) OR . A paper copy in the mail If you have any lab test that is abnormal or we need to change your treatment, we will call you to review the results.  Testing/Procedures: Your physician has requested that you have an echocardiogram. Echocardiography is a painless test that uses sound waves to create images of your heart. It provides your doctor with information about the size and shape of your heart and how well your heart's chambers and valves are working. This procedure takes approximately one hour. There are no restrictions for this procedure. You may get an IV, if needed, to receive an ultrasound enhancing agent through to better visualize your heart.     Follow-Up: At [facility], you and your health needs are our priority.  As part of our continuing mission to provide you with exceptional heart care, we have created designated Provider Care Teams.  These Care Teams include your primary Cardiologist (physician) and Advanced Practice Providers (APPs -  Physician Assistants and Nurse Practitioners) who all work together to provide you with the care you need, when you need it. You will need a follow up appointment in 6 months.  Please call our office 2 months in advance to schedule this appointment.  You may see DR Harrell Gave END or one of the following Advanced Practice Providers on your designated Care Team:   Murray Hodgkins, NP Christell Faith, PA-C . Marrianne Mood, PA-C     Echocardiogram An echocardiogram is a procedure that uses painless sound waves (ultrasound) to produce  an image of the heart. Images from an echocardiogram can provide important information about:  Signs of coronary artery disease (CAD).  Aneurysm detection. An aneurysm is a weak or damaged part of an artery wall that bulges out from the normal force of blood pumping through the body.  Heart size and shape. Changes in the size or shape of the heart can be associated with certain conditions, including heart failure, aneurysm, and CAD.  Heart muscle function.  Heart valve function.  Signs of a past heart attack.  Fluid buildup around the heart.  Thickening of the heart muscle.  A tumor or infectious growth around the heart valves. Tell a health care provider about:  Any allergies you have.  All medicines you are taking, including vitamins, herbs, eye drops, creams, and over-the-counter medicines.  Any blood disorders you have.  Any surgeries you have had.  Any medical conditions you have.  Whether you are pregnant or may be pregnant. What are the risks? Generally, this is a safe procedure. However, problems may occur, including:  Allergic reaction to dye (contrast) that may be used during the procedure. What happens before the procedure? No specific preparation is needed. You may eat and drink normally. What happens during the procedure?   An IV tube may be inserted into one of your veins.  You may receive contrast through this tube. A contrast is an injection that improves the quality of the pictures from your heart.  A gel will be applied to your chest.  A wand-like tool (transducer) will be moved  over your chest. The gel will help to transmit the sound waves from the transducer.  The sound waves will harmlessly bounce off of your heart to allow the heart images to be captured in real-time motion. The images will be recorded on a computer. The procedure may vary among health care providers and hospitals. What happens after the procedure?  You may return to your  normal, everyday life, including diet, activities, and medicines, unless your health care provider tells you not to do that. Summary  An echocardiogram is a procedure that uses painless sound waves (ultrasound) to produce an image of the heart.  Images from an echocardiogram can provide important information about the size and shape of your heart, heart muscle function, heart valve function, and fluid buildup around your heart.  You do not need to do anything to prepare before this procedure. You may eat and drink normally.  After the echocardiogram is completed, you may return to your normal, everyday life, unless your health care provider tells you not to do that. This information is not intended to replace advice given to you by your health care provider. Make sure you discuss any questions you have with your health care provider. Document Released: 07/24/2000 Document Revised: 08/29/2016 Document Reviewed: 08/29/2016 Elsevier Interactive Patient Education  2019 [employer].

## 2018-08-22 NOTE — Progress Notes (Signed)
New Outpatient Visit Date: 08/22/2018  Referring Provider: Cristy Folks, PA-C [address], [place] [address]  Chief Complaint: Heart murmur  HPI:  Diane Logan is a 58 y.o. female who is being seen today for the evaluation of heart murmur at the request of [name]. She has a history of HTN, HLD, DM2, and aortic stenosis (no details available).  [name] reports being told of a heart murmur in her 40's but has never undergone imaging (I.e. echo).  She has been asymptomatic, denying chest pain, shortness of breath, palpitations, lightheadedness, orthopnea, PND, and edema.  Her PCP referred her because she felt that it is important to establish the source of the murmur.  Ms. Barbar reports being compliant with her medications, though there was recently a mixup at her pharmacy and she was taking a different patient's BP medications for ~ 3 months.  She did not have any adverse effects.  She is currently on niacin for treatment of her hyperlipidemia, as she would like to limit the number of prescription medications that she is on  --------------------------------------------------------------------------------------------------  Cardiovascular History & Procedures: Cardiovascular Problems:  Heart murmur  Risk Factors:  HTN, HLD, DM, and obesity.  Cath/PCI:  None.  CV Surgery:  None.  EP Procedures and Devices:  None.  Non-Invasive Evaluation(s):  None.  --------------------------------------------------------------------------------------------------  Past Medical History:  Diagnosis Date  . Anxiety   . Diabetes mellitus without complication ([place])   . Headache    stress - several times per week  . Heart murmur   . Hypertension   . Macular degeneration     Past Surgical History:  Procedure Laterality Date  . ABDOMINAL HYSTERECTOMY  2010  . COLONOSCOPY WITH PROPOFOL N/A 05/18/2018   Procedure: COLONOSCOPY WITH PROPOFOL;  Surgeon: Lin Landsman, MD;  Location: [facility];  Service: Endoscopy;  Laterality: N/A;  Diabetic - oral meds  . OOPHORECTOMY    . POLYPECTOMY N/A 05/18/2018   Procedure: POLYPECTOMY;  Surgeon: Lin Landsman, MD;  Location: [facility];  Service: Endoscopy;  Laterality: N/A;    Current Meds  Medication Sig  . amLODipine (NORVASC) 5 MG tablet Take 5 mg by mouth.  [name] lisinopril (PRINIVIL,ZESTRIL) 40 MG tablet TAKE 1 TABLET BY MOUTH EVERY DAY  . metFORMIN (GLUCOPHAGE) 500 MG tablet Take 500 mg by mouth.  . Multiple Vitamin (MULTIVITAMIN) tablet Take 1 tablet by mouth daily.  [name] NIACIN PO Take by mouth daily.  . Omega-3 Fatty Acids (FISH OIL PO) Take by mouth daily.  [name] PARoxetine (PAXIL) 40 MG tablet Take 40 mg by mouth.    Allergies: Patient has no known allergies.  Social History   Tobacco Use  . Smoking status: Former Research scientist (life sciences)  . Smokeless tobacco: Never Used  . Tobacco comment: socially in 57s  Substance Use Topics  . Alcohol use: Yes    Comment: 1 drink per month  . Drug use: No    Family History  Adopted: Yes    Review of Systems: A 12-system review of systems was performed and was negative except as noted in the HPI.  --------------------------------------------------------------------------------------------------  Physical Exam: BP 122/70 (BP Location: Right Arm, Patient Position: Sitting, Cuff Size: Normal)   Pulse (!) 58   Ht 5\' 6"  (1.676 m)   Wt 227 lb (103 kg)   LMP 06/03/2009 (Approximate)   BMI 36.64 kg/m   General:  NAD HEENT: No conjunctival pallor or scleral icterus. Moist mucous membranes. OP clear.  Neck: Supple without lymphadenopathy, thyromegaly, JVD, or HJR. No carotid bruit. Lungs: Normal work of breathing. Clear to auscultation bilaterally without wheezes or crackles. Heart: Regular rate and rhythm with 2/6 systolic murmur heart across the precordium.  No rubs or gallops. Abd: Bowel sounds present. Soft, NT/ND.  Unable to assess HSM  due to body habitus. Ext: No lower extremity edema. Radial, PT, and DP pulses are 2+ bilaterally Skin: Warm and dry without rash. Neuro: CNIII-XII intact. Strength and fine-touch sensation intact in upper and lower extremities bilaterally. Psych: Normal mood and affect.  EKG:  NSR with RBBB (new since 2010).  Outside labs (04/02/18) Na 140, K 4.7, Cl 101, BUN 18, creatinine 0.7, glucose 192, Ca 9.5, hemoglobin A1c 6.9%  --------------------------------------------------------------------------------------------------  ASSESSMENT AND PLAN: Heart murmur Noted on exam today.  Most consistent with aortic etiology.  No symptoms reported by the patient.  However, we will obtain an echocardiogram for further assessment.  RBBB New since 2010.  Proceed with echo, as above.  We will consider MPI versus CTA versus cath based on results in the setting of multiple cardiac risk factors (HTN, HLD, DM, and obesity).  Hyperlipidemia Currently on niacin, which I have recommended that she discontinue given lack of evidence.  She is due to see her PCP later this week; lipid panel should be considered if not drawn recently.  Given h/o DM, moderate to high intensity statin is indicated.  DM2 Most recent A1c 6.9.  Continue f/u with PCP.  Follow-up: Return to clinic in 6 months.  [name], MD 08/22/2018 3:51 PM

## 2018-08-23 ENCOUNTER — Encounter: Payer: Self-pay | Admitting: Internal Medicine

## 2018-08-23 DIAGNOSIS — R011 Cardiac murmur, unspecified: Secondary | ICD-10-CM | POA: Insufficient documentation

## 2018-08-23 DIAGNOSIS — I4510 Unspecified right bundle-branch block: Secondary | ICD-10-CM | POA: Insufficient documentation

## 2018-09-27 ENCOUNTER — Ambulatory Visit (INDEPENDENT_AMBULATORY_CARE_PROVIDER_SITE_OTHER): Payer: 59

## 2018-09-27 DIAGNOSIS — R011 Cardiac murmur, unspecified: Secondary | ICD-10-CM

## 2018-09-27 DIAGNOSIS — I4510 Unspecified right bundle-branch block: Secondary | ICD-10-CM | POA: Diagnosis not present

## 2018-09-29 ENCOUNTER — Telehealth: Payer: Self-pay

## 2018-09-29 DIAGNOSIS — R011 Cardiac murmur, unspecified: Secondary | ICD-10-CM

## 2018-09-29 NOTE — Telephone Encounter (Signed)
Attempted to call patient. LMTCB 09/29/2018

## 2018-09-29 NOTE — Telephone Encounter (Signed)
Reviewed results and recommendations by provider and also instructions for stress testing. Advised that I would have someone from scheduling give her a call to help set that up. She verbalized understanding with no further questions at this time.   [place]  Your caregiver has ordered a Stress Test with nuclear imaging. The purpose of this test is to evaluate the blood supply to your heart muscle. This procedure is referred to as a "Non-Invasive Stress Test." This is because other than having an IV started in your vein, nothing is inserted or "invades" your body. Cardiac stress tests are done to find areas of poor blood flow to the heart by determining the extent of coronary artery disease (CAD). Some patients exercise on a treadmill, which naturally increases the blood flow to your heart, while others who are  unable to walk on a treadmill due to physical limitations have a pharmacologic/chemical stress agent called Lexiscan . This medicine will mimic walking on a treadmill by temporarily increasing your coronary blood flow.   Please note: these test may take anywhere between 2-4 hours to complete  PLEASE REPORT TO [facility] AT THE FIRST DESK WILL DIRECT YOU WHERE TO GO  Date of Procedure:_____________________________________  Arrival Time for Procedure:______________________________  Instructions regarding medication:   _XX___ : Hold diabetes medication Metformin the morning of procedure  PLEASE NOTIFY THE OFFICE AT LEAST 24 HOURS IN ADVANCE IF YOU ARE UNABLE TO KEEP YOUR APPOINTMENT.  [phone] AND  PLEASE NOTIFY NUCLEAR MEDICINE AT [facility] AT LEAST 24 HOURS IN ADVANCE IF YOU ARE UNABLE TO KEEP YOUR APPOINTMENT. [phone]  How to prepare for your Myoview test:  1. Do not eat or drink after midnight 2. No caffeine for 24 hours prior to test 3. No smoking 24 hours prior to test. 4. Your medication may be taken with water.  If your doctor stopped a  medication because of this test, do not take that medication. 5. Ladies, please do not wear dresses.  Skirts or pants are appropriate. Please wear a short sleeve shirt. 6. No perfume, cologne or lotion. 7. Wear comfortable walking shoes. No heels!

## 2018-09-29 NOTE — Telephone Encounter (Signed)
-----   Message from [name], MD sent at 09/28/2018  8:06 PM EST ----- Please let Ms. Dow know that her echo shows mild thickening of the aortic and mitral valves, which most likely accounts for her heart murmur.  Her heart is contracting well.  There is no significant leakage or narrowing of her valves.  Given RBBB and DM, I recommend a pharmacologic myocardial perfusion stress test at her convenience.

## 2018-09-30 NOTE — Telephone Encounter (Signed)
lmov to schedule Lexi

## 2018-10-12 NOTE — Telephone Encounter (Signed)
Scheduled 3/19 @ 9

## 2018-11-07 ENCOUNTER — Ambulatory Visit
Admission: RE | Admit: 2018-11-07 | Discharge: 2018-11-07 | Disposition: A | Discharge status: Home / Self Care | Payer: No Typology Code available for payment source | Source: Ambulatory Visit | Attending: Internal Medicine | Admitting: Internal Medicine

## 2018-11-07 ENCOUNTER — Other Ambulatory Visit: Payer: Self-pay

## 2018-11-07 DIAGNOSIS — R011 Cardiac murmur, unspecified: Secondary | ICD-10-CM | POA: Diagnosis not present

## 2018-11-07 LAB — NM MYOCAR MULTI W/SPECT W/WALL MOTION / EF
CHL CUP RESTING HR STRESS: 58 {beats}/min
CSEPED: 0 min
CSEPEDS: 0 s
CSEPEW: 1 METS
CSEPHR: 49 %
LV sys vol: 33 mL
LVDIAVOL: 95 mL (ref 46–106)
MPHR: 161 {beats}/min
Peak HR: 80 {beats}/min
SDS: 13
SRS: 6
SSS: 21
TID: 1.13

## 2018-11-07 MED ORDER — TECHNETIUM TC 99M TETROFOSMIN IV KIT
10.4400 | PACK | Freq: Once | INTRAVENOUS | Status: AC | PRN
  Administered 2018-11-07: 10.44 via INTRAVENOUS

## 2018-11-07 MED ORDER — TECHNETIUM TC 99M TETROFOSMIN IV KIT
32.4500 | PACK | Freq: Once | INTRAVENOUS | Status: AC | PRN
  Administered 2018-11-07: 32.45 via INTRAVENOUS

## 2018-11-07 MED ORDER — REGADENOSON 0.4 MG/5ML IV SOLN
0.4000 mg | Freq: Once | INTRAVENOUS | Status: AC
  Administered 2018-11-07: 0.4 mg via INTRAVENOUS
  Filled 2018-11-07: qty 5

## 2019-11-22 ENCOUNTER — Telehealth: Payer: Self-pay | Admitting: Internal Medicine

## 2019-11-22 NOTE — Telephone Encounter (Signed)
3 attempts to schedule fu appt from recall list.   Deleting recall.   

## 2021-05-19 ENCOUNTER — Ambulatory Visit
Admission: EM | Admit: 2021-05-19 | Discharge: 2021-05-19 | Disposition: A | Discharge status: Home / Self Care | Payer: Self-pay | Attending: Family Medicine | Admitting: Family Medicine

## 2021-05-19 ENCOUNTER — Other Ambulatory Visit: Payer: Self-pay

## 2021-05-19 ENCOUNTER — Encounter: Payer: Self-pay | Admitting: Emergency Medicine

## 2021-05-19 DIAGNOSIS — Z79899 Other long term (current) drug therapy: Secondary | ICD-10-CM | POA: Insufficient documentation

## 2021-05-19 DIAGNOSIS — E119 Type 2 diabetes mellitus without complications: Secondary | ICD-10-CM | POA: Diagnosis not present

## 2021-05-19 DIAGNOSIS — U071 COVID-19: Secondary | ICD-10-CM | POA: Insufficient documentation

## 2021-05-19 DIAGNOSIS — J069 Acute upper respiratory infection, unspecified: Secondary | ICD-10-CM | POA: Diagnosis present

## 2021-05-19 DIAGNOSIS — R011 Cardiac murmur, unspecified: Secondary | ICD-10-CM | POA: Diagnosis not present

## 2021-05-19 DIAGNOSIS — Z7984 Long term (current) use of oral hypoglycemic drugs: Secondary | ICD-10-CM | POA: Insufficient documentation

## 2021-05-19 MED ORDER — BENZONATATE 200 MG PO CAPS: 200.0000 mg | ORAL_CAPSULE | Freq: Three times a day (TID) | ORAL | 0 refills | Status: DC | PRN

## 2021-05-19 NOTE — ED Triage Notes (Signed)
Pt presents today with c/o nasal congestion, facial pain, cough and low grade temp that began yesterday. No home Covid tests.

## 2021-05-19 NOTE — ED Provider Notes (Signed)
[facility]    CSN: [id number] Arrival date & time: 05/19/21  1025      History   Chief Complaint Chief Complaint  Patient presents with   Nasal Congestion   Facial Pain   Cough    HPI Diane Logan is a 60 y.o. female who presents with onset of nose congestion, face pain, cough and low grade temp since yesterday. Has not done a covid test at home. Has not been exposed to anyone with Covid, but works at [employer] and her coworkers have been sick     Past Medical History:  Diagnosis Date   Anxiety    Diabetes mellitus without complication ([place])    Headache    stress - several times per week   Heart murmur    Hypertension    Macular degeneration     Patient Active Problem List   Diagnosis Date Noted   Murmur 08/23/2018   Right bundle branch block 08/23/2018   Encounter for screening colonoscopy    Anxiety 09/17/2017   Type 2 diabetes mellitus without complication, without long-term current use of insulin ([place]) 09/17/2017   Hyperlipidemia, unspecified 09/17/2017   Hypertension 09/17/2017   Snoring 12/14/2016   OAB (overactive bladder) 12/04/2016    Past Surgical History:  Procedure Laterality Date   ABDOMINAL HYSTERECTOMY  2010   COLONOSCOPY WITH PROPOFOL N/A 05/18/2018   Procedure: COLONOSCOPY WITH PROPOFOL;  Surgeon: Lin Landsman, MD;  Location: [facility];  Service: Endoscopy;  Laterality: N/A;  Diabetic - oral meds   OOPHORECTOMY     POLYPECTOMY N/A 05/18/2018   Procedure: POLYPECTOMY;  Surgeon: Lin Landsman, MD;  Location: [facility];  Service: Endoscopy;  Laterality: N/A;    OB History   No obstetric history on file.      Home Medications    Prior to Admission medications   Medication Sig Start Date End Date Taking? Authorizing Provider  benzonatate (TESSALON) 200 MG capsule Take 1 capsule (200 mg total) by mouth 3 (three) times daily as needed for cough. 05/19/21  Yes Rodriguez-Southworth, Sunday Spillers, PA-C   amLODipine (NORVASC) 5 MG tablet Take 5 mg by mouth.    [provider]  atorvastatin (LIPITOR) 20 MG tablet Take 20 mg by mouth daily. 04/21/21   [provider]  lisinopril (PRINIVIL,ZESTRIL) 40 MG tablet TAKE 1 TABLET BY MOUTH EVERY DAY 08/27/11   [provider]  metFORMIN (GLUCOPHAGE) 500 MG tablet Take 500 mg by mouth.    [provider]  Multiple Vitamin (MULTIVITAMIN) tablet Take 1 tablet by mouth daily.    [provider]  Omega-3 Fatty Acids (FISH OIL PO) Take by mouth daily.    [provider]  PARoxetine (PAXIL) 40 MG tablet Take 40 mg by mouth.    [provider]    Family History Family History  Adopted: Yes    Social History Social History   Tobacco Use   Smoking status: Former   Smokeless tobacco: Never   Tobacco comments:    socially in 61s  Vaping Use   Vaping Use: Never used  Substance Use Topics   Alcohol use: Yes    Comment: 1 drink per month   Drug use: No     Allergies   Patient has no known allergies.   Review of Systems Review of Systems  Constitutional:  Positive for fatigue and fever. Negative for appetite change.  HENT:  Positive for congestion and rhinorrhea. Negative for ear discharge, ear pain,  sore throat and trouble swallowing.   Respiratory:  Positive for cough. Negative for chest tightness, shortness of breath and wheezing.   Skin:  Negative for rash.    Physical Exam Triage Vital Signs ED Triage Vitals  Enc Vitals Group     BP 05/19/21 1132 (!) 152/79     Pulse Rate 05/19/21 1132 72     Resp 05/19/21 1132 18     Temp 05/19/21 1132 99.2 F (37.3 C)     Temp Source 05/19/21 1132 Oral     SpO2 05/19/21 1132 97 %     Weight --      Height --      Head Circumference --      Peak Flow --      Pain Score 05/19/21 1125 6     Pain Loc --      Pain Edu? --      Excl. in [place]? --    No data found.  Updated Vital Signs BP (!) 152/79 (BP Location: Right Arm)   Pulse  72   Temp 99.2 F (37.3 C) (Oral)   Resp 18   LMP 06/03/2009 (Approximate)   SpO2 97%   Visual Acuity Right Eye Distance:   Left Eye Distance:   Bilateral Distance:    Right Eye Near:   Left Eye Near:    Bilateral Near:     Physical Exam Physical Exam Vitals signs and nursing note reviewed.  Constitutional:      General: She is not in acute distress.    Appearance: Normal appearance. She is not ill-appearing, toxic-appearing or diaphoretic.  HENT:     Head: Normocephalic.     Right Ear: Tympanic membrane, ear canal and external ear normal.     Left Ear: Tympanic membrane, ear canal and external ear normal.     Nose: Nose normal.     Mouth/Throat:     Mouth: Mucous membranes are moist.  Eyes:     General: No scleral icterus.       Right eye: No discharge.        Left eye: No discharge.     Conjunctiva/sclera: Conjunctivae normal.  Neck:     Musculoskeletal: Neck supple. No neck rigidity.  Cardiovascular:     Rate and Rhythm: Normal rate and regular rhythm.     Heart sounds: No murmur.  Pulmonary:     Effort: Pulmonary effort is normal.     Breath sounds: Normal breath sounds.  Musculoskeletal: Normal range of motion.  Lymphadenopathy:     Cervical: No cervical adenopathy.  Skin:    General: Skin is warm and dry.     Coloration: Skin is not jaundiced.     Findings: No rash.  Neurological:     Mental Status: She is alert and oriented to person, place, and time.     Gait: Gait normal.  Psychiatric:        Mood and Affect: Mood normal.        Behavior: Behavior normal.        Thought Content: Thought content normal.        Judgment: Judgment normal.    UC Treatments / Results  Labs (all labs ordered are listed, but only abnormal results are displayed) Labs Reviewed  SARS CORONAVIRUS 2 (TAT 6-24 HRS)    EKG   Radiology No results found.  Procedures Procedures (including critical care time)  Medications Ordered in UC Medications - No data to  display  Initial  Impression / Assessment and Plan / UC Course  I have reviewed the triage vital signs and the nursing notes. URI, covid test pending. I prescribed her Tessalon [place].  See instructions.      Final Clinical Impressions(s) / UC Diagnoses   Final diagnoses:  Upper respiratory tract infection, unspecified type     Discharge Instructions      You may use Flonase as needed for stuffy nose and post nasal drainage which is now over the counter Cold last up to 2 weeks, but if you have a fever of 100.5 or more and gets worse, you need to be seen again.  Stay quarantined until your Covid test is back.      ED Prescriptions     Medication Sig Dispense Auth. Provider   benzonatate (TESSALON) 200 MG capsule Take 1 capsule (200 mg total) by mouth 3 (three) times daily as needed for cough. 30 capsule Rodriguez-Southworth, Sunday Spillers, PA-C      PDMP not reviewed this encounter.   Shelby Mattocks, PA-C 05/19/21 1346

## 2021-05-19 NOTE — Discharge Instructions (Addendum)
You may use Flonase as needed for stuffy nose and post nasal drainage which is now over the counter Cold last up to 2 weeks, but if you have a fever of 100.5 or more and gets worse, you need to be seen again.  Stay quarantined until your Covid test is back.

## 2021-05-20 LAB — SARS CORONAVIRUS 2 (TAT 6-24 HRS): SARS Coronavirus 2: POSITIVE — AB

## 2021-06-15 ENCOUNTER — Ambulatory Visit
Admission: EM | Admit: 2021-06-15 | Discharge: 2021-06-15 | Disposition: A | Discharge status: Home / Self Care | Payer: Self-pay | Attending: Internal Medicine | Admitting: Internal Medicine

## 2021-06-15 ENCOUNTER — Other Ambulatory Visit: Payer: Self-pay

## 2021-06-15 ENCOUNTER — Ambulatory Visit (INDEPENDENT_AMBULATORY_CARE_PROVIDER_SITE_OTHER): Payer: Self-pay

## 2021-06-15 ENCOUNTER — Encounter: Payer: Self-pay | Admitting: Emergency Medicine

## 2021-06-15 DIAGNOSIS — M25511 Pain in right shoulder: Secondary | ICD-10-CM

## 2021-06-15 DIAGNOSIS — S43421A Sprain of right rotator cuff capsule, initial encounter: Secondary | ICD-10-CM

## 2021-06-15 DIAGNOSIS — W19XXXA Unspecified fall, initial encounter: Secondary | ICD-10-CM

## 2021-06-15 NOTE — ED Triage Notes (Signed)
Patient states that she fell a week ago and injured her right shoulder.  Patient c/o pain in her right shoulder and upper back.

## 2021-06-15 NOTE — Discharge Instructions (Signed)
Gentle range of motion exercises Icing of the right shoulder Please take ibuprofen as needed for pain If your symptoms does not improve in the next couple of weeks please follow-up with the sports medicine team for further evaluation.

## 2021-06-16 NOTE — ED Provider Notes (Signed)
[facility]    CSN: [id number] Arrival date & time: 06/15/21  1452      History   Chief Complaint Chief Complaint  Patient presents with   Fall   Shoulder Pain    right    HPI Diane Logan is a 60 y.o. female comes to the urgent care with right shoulder pain of 1 week duration.  Patient fell and hit her right shoulder on an asphalt road a week ago.  She was going to take pictures of her dear when she lost her balance and fell.  Pain is currently 7 out of 10, sharp, aggravated by movement and not fully relieved by taking ibuprofen.  No numbness or tingling of the right upper extremity.  No weakness.  Patient denies any neck pain.  She did not hit her head during the fall.  She denies any fever or chills.   HPI  Past Medical History:  Diagnosis Date   Anxiety    Diabetes mellitus without complication ([place])    Headache    stress - several times per week   Heart murmur    Hypertension    Macular degeneration     Patient Active Problem List   Diagnosis Date Noted   Murmur 08/23/2018   Right bundle branch block 08/23/2018   Encounter for screening colonoscopy    Anxiety 09/17/2017   Type 2 diabetes mellitus without complication, without long-term current use of insulin ([place]) 09/17/2017   Hyperlipidemia, unspecified 09/17/2017   Hypertension 09/17/2017   Snoring 12/14/2016   OAB (overactive bladder) 12/04/2016    Past Surgical History:  Procedure Laterality Date   ABDOMINAL HYSTERECTOMY  2010   COLONOSCOPY WITH PROPOFOL N/A 05/18/2018   Procedure: COLONOSCOPY WITH PROPOFOL;  Surgeon: Lin Landsman, MD;  Location: [facility];  Service: Endoscopy;  Laterality: N/A;  Diabetic - oral meds   OOPHORECTOMY     POLYPECTOMY N/A 05/18/2018   Procedure: POLYPECTOMY;  Surgeon: Lin Landsman, MD;  Location: [facility];  Service: Endoscopy;  Laterality: N/A;    OB History   No obstetric history on file.      Home Medications    Prior  to Admission medications   Medication Sig Start Date End Date Taking? Authorizing Provider  amLODipine (NORVASC) 5 MG tablet Take 5 mg by mouth.    [provider]  atorvastatin (LIPITOR) 20 MG tablet Take 20 mg by mouth daily. 04/21/21   [provider]  benzonatate (TESSALON) 200 MG capsule Take 1 capsule (200 mg total) by mouth 3 (three) times daily as needed for cough. 05/19/21   [place], Sunday Spillers, PA-C  lisinopril (PRINIVIL,ZESTRIL) 40 MG tablet TAKE 1 TABLET BY MOUTH EVERY DAY 08/27/11   [provider]  metFORMIN (GLUCOPHAGE) 500 MG tablet Take 500 mg by mouth.    [provider]  Multiple Vitamin (MULTIVITAMIN) tablet Take 1 tablet by mouth daily.    [provider]  Omega-3 Fatty Acids (FISH OIL PO) Take by mouth daily.    [provider]  PARoxetine (PAXIL) 40 MG tablet Take 40 mg by mouth.    [provider]    Family History Family History  Adopted: Yes    Social History Social History   Tobacco Use   Smoking status: Former   Smokeless tobacco: Never   Tobacco comments:    socially in 71s  Vaping Use   Vaping Use: Never used  Substance Use Topics   Alcohol use: Yes  Comment: 1 drink per month   Drug use: No     Allergies   Patient has no known allergies.   Review of Systems Review of Systems  HENT: Negative.    Genitourinary: Negative.   Musculoskeletal:  Positive for arthralgias. Negative for joint swelling, myalgias, neck pain and neck stiffness.  Neurological: Negative.     Physical Exam Triage Vital Signs ED Triage Vitals  Enc Vitals Group     BP 06/15/21 1528 (!) 150/71     Pulse Rate 06/15/21 1528 63     Resp 06/15/21 1528 14     Temp 06/15/21 1528 98.4 F (36.9 C)     Temp Source 06/15/21 1528 Oral     SpO2 06/15/21 1528 99 %     Weight 06/15/21 1525 220 lb (99.8 kg)     Height 06/15/21 1525 5\' 6"  (1.676 m)     Head Circumference --      Peak Flow --      Pain  Score 06/15/21 1525 7     Pain Loc --      Pain Edu? --      Excl. in [place]? --    No data found.  Updated Vital Signs BP (!) 150/71 (BP Location: Left Arm)   Pulse 63   Temp 98.4 F (36.9 C) (Oral)   Resp 14   Ht 5\' 6"  (1.676 m)   Wt 99.8 kg   LMP 06/03/2009 (Approximate)   SpO2 99%   BMI 35.51 kg/m   Visual Acuity Right Eye Distance:   Left Eye Distance:   Bilateral Distance:    Right Eye Near:   Left Eye Near:    Bilateral Near:     Physical Exam Vitals and nursing note reviewed.  Constitutional:      General: She is not in acute distress.    Appearance: She is not ill-appearing.  Cardiovascular:     Rate and Rhythm: Normal rate and regular rhythm.  Musculoskeletal:        General: Tenderness present. No swelling or deformity. Normal range of motion.     Comments: Tenderness on palpation over the anterior half of the rotator cuff of the right shoulder.  Neurological:     Mental Status: She is alert.     UC Treatments / Results  Labs (all labs ordered are listed, but only abnormal results are displayed) Labs Reviewed - No data to display  EKG   Radiology DG Shoulder Right  Result Date: 06/15/2021 CLINICAL DATA:  Fall 1 week ago. Persistent right shoulder pain. Initial encounter. EXAM: RIGHT SHOULDER - 2+ VIEW COMPARISON:  None. FINDINGS: There is no evidence of fracture or dislocation. Degenerative spurring is seen involving the acromioclavicular joint. Degenerative spurring is also seen involving the greater tuberosity of the humeral head. No other focal bone lesions identified. Soft tissues are unremarkable. IMPRESSION: No acute findings. Degenerative changes, as described above. Electronically Signed   By: Marlaine Hind M.D.   On: 06/15/2021 16:18    Procedures Procedures (including critical care time)  Medications Ordered in UC Medications - No data to display  Initial Impression / Assessment and Plan / UC Course  I have reviewed the triage vital  signs and the nursing notes.  Pertinent labs & imaging results that were available during my care of the patient were reviewed by me and considered in my medical decision making (see chart for details).     1.  Right shoulder sprain: X-ray is negative  for fracture.  Degenerative changes present Continue gentle range of motion exercises Icing of the right shoulder Ibuprofen as needed for pain If symptoms persist after the next couple of weeks you may benefit from a visit to the sports medicine clinic for further evaluation. Return precautions given. Final Clinical Impressions(s) / UC Diagnoses   Final diagnoses:  Sprain of right rotator cuff capsule, initial encounter     Discharge Instructions      Gentle range of motion exercises Icing of the right shoulder Please take ibuprofen as needed for pain If your symptoms does not improve in the next couple of weeks please follow-up with the sports medicine team for further evaluation.   ED Prescriptions   None    PDMP not reviewed this encounter.   Chase Picket, MD 06/16/21 [phone]

## 2021-12-30 ENCOUNTER — Ambulatory Visit
Admission: EM | Admit: 2021-12-30 | Discharge: 2021-12-30 | Disposition: A | Discharge status: Home / Self Care | Payer: 59 | Attending: Physician Assistant | Admitting: Physician Assistant

## 2021-12-30 DIAGNOSIS — B349 Viral infection, unspecified: Secondary | ICD-10-CM

## 2021-12-30 DIAGNOSIS — R051 Acute cough: Secondary | ICD-10-CM

## 2021-12-30 DIAGNOSIS — R509 Fever, unspecified: Secondary | ICD-10-CM

## 2021-12-30 DIAGNOSIS — Z20822 Contact with and (suspected) exposure to covid-19: Secondary | ICD-10-CM | POA: Insufficient documentation

## 2021-12-30 LAB — RESP PANEL BY RT-PCR (FLU A&B, COVID) ARPGX2
Influenza A by PCR: NEGATIVE
Influenza B by PCR: NEGATIVE
SARS Coronavirus 2 by RT PCR: NEGATIVE

## 2021-12-30 MED ORDER — BENZONATATE 200 MG PO CAPS: 200.0000 mg | ORAL_CAPSULE | Freq: Three times a day (TID) | ORAL | 0 refills | Status: DC | PRN

## 2021-12-30 NOTE — ED Triage Notes (Signed)
Pt c/o possible sinus infection.  Coughing until Vomiting, Temperature of 100.7, chest congestion, cough x2-3days

## 2021-12-30 NOTE — ED Provider Notes (Addendum)
[facility]    CSN: [id number] Arrival date & time: 12/30/21  1308      History   Chief Complaint Chief Complaint  Patient presents with   Cough   Nasal Congestion    HPI Reality Diane Logan is a 61 y.o. female presenting for 2 to 3-day history of temps up to 100.7 degrees, cough and posttussive vomiting.  Patient also reports some mild nasal congestion.  She denies feeling short of breath or having chest pain.  No abdominal pain or diarrhea.  Husband has been ill with similar symptoms but says his is due to allergies.  She has taken OTC meds without any real improvement in her symptoms.  She is concerned for possible sinus infection but does deny any sinus pain.  History significant for diabetes, hypertension, anxiety.  Presents today of COVID-19 7 months ago.  HPI  Past Medical History:  Diagnosis Date   Anxiety    Diabetes mellitus without complication ([place])    Headache    stress - several times per week   Heart murmur    Hypertension    Macular degeneration     Patient Active Problem List   Diagnosis Date Noted   Murmur 08/23/2018   Right bundle branch block 08/23/2018   Encounter for screening colonoscopy    Anxiety 09/17/2017   Type 2 diabetes mellitus without complication, without long-term current use of insulin ([place]) 09/17/2017   Hyperlipidemia, unspecified 09/17/2017   Hypertension 09/17/2017   Snoring 12/14/2016   OAB (overactive bladder) 12/04/2016    Past Surgical History:  Procedure Laterality Date   ABDOMINAL HYSTERECTOMY  2010   COLONOSCOPY WITH PROPOFOL N/A 05/18/2018   Procedure: COLONOSCOPY WITH PROPOFOL;  Surgeon: Lin Landsman, MD;  Location: [facility];  Service: Endoscopy;  Laterality: N/A;  Diabetic - oral meds   OOPHORECTOMY     POLYPECTOMY N/A 05/18/2018   Procedure: POLYPECTOMY;  Surgeon: Lin Landsman, MD;  Location: [facility];  Service: Endoscopy;  Laterality: N/A;    OB History   No obstetric  history on file.      Home Medications    Prior to Admission medications   Medication Sig Start Date End Date Taking? Authorizing Provider  amLODipine (NORVASC) 5 MG tablet Take 5 mg by mouth.   Yes [provider]  atorvastatin (LIPITOR) 20 MG tablet Take 20 mg by mouth daily. 04/21/21  Yes [provider]  lisinopril (PRINIVIL,ZESTRIL) 40 MG tablet TAKE 1 TABLET BY MOUTH EVERY DAY 08/27/11  Yes [provider]  metFORMIN (GLUCOPHAGE) 500 MG tablet Take 500 mg by mouth.   Yes [provider]  Multiple Vitamin (MULTIVITAMIN) tablet Take 1 tablet by mouth daily.   Yes [provider]  Omega-3 Fatty Acids (FISH OIL PO) Take by mouth daily.   Yes [provider]  PARoxetine (PAXIL) 40 MG tablet Take 40 mg by mouth.   Yes [provider]  benzonatate (TESSALON) 200 MG capsule Take 1 capsule (200 mg total) by mouth 3 (three) times daily as needed for cough. 12/30/21   Danton Clap, PA-C    Family History Family History  Adopted: Yes    Social History Social History   Tobacco Use   Smoking status: Former   Smokeless tobacco: Never   Tobacco comments:    socially in 6s  Vaping Use   Vaping Use: Never used  Substance Use Topics   Alcohol use: Not Currently    Comment: 1 drink  per month   Drug use: No     Allergies   Patient has no known allergies.   Review of Systems Review of Systems  Constitutional:  Positive for fatigue and fever. Negative for chills and diaphoresis.  HENT:  Positive for congestion. Negative for ear pain, rhinorrhea, sinus pressure, sinus pain and sore throat.   Respiratory:  Positive for cough. Negative for shortness of breath and wheezing.   Cardiovascular:  Negative for chest pain.  Gastrointestinal:  Positive for vomiting. Negative for abdominal pain and nausea.  Musculoskeletal:  Negative for arthralgias and myalgias.  Skin:  Negative for rash.  Neurological:  Positive for headaches.  Negative for weakness.  Hematological:  Negative for adenopathy.    Physical Exam Triage Vital Signs ED Triage Vitals  Enc Vitals Group     BP 12/30/21 1353 (!) 57/28     Pulse Rate 12/30/21 1353 73     Resp 12/30/21 1353 18     Temp 12/30/21 1353 98.9 F (37.2 C)     Temp Source 12/30/21 1353 Oral     SpO2 12/30/21 1353 98 %     Weight 12/30/21 1351 215 lb (97.5 kg)     Height 12/30/21 1351 '5\' 6"'$  (1.676 m)     Head Circumference --      Peak Flow --      Pain Score 12/30/21 1350 9     Pain Loc --      Pain Edu? --      Excl. in [place]? --    No data found.  Updated Vital Signs BP (!) 147/77 (BP Location: Left Arm)   Pulse 73   Temp 98.9 F (37.2 C) (Oral)   Resp 18   Ht '5\' 6"'$  (1.676 m)   Wt 215 lb (97.5 kg)   LMP 06/03/2009 (Approximate)   SpO2 98%   BMI 34.70 kg/m   Physical Exam Vitals and nursing note reviewed.  Constitutional:      General: She is not in acute distress.    Appearance: Normal appearance. She is ill-appearing. She is not toxic-appearing.  HENT:     Head: Normocephalic and atraumatic.     Nose: Congestion present.     Mouth/Throat:     Mouth: Mucous membranes are moist.     Pharynx: Oropharynx is clear.  Eyes:     General: No scleral icterus.       Right eye: No discharge.        Left eye: No discharge.     Conjunctiva/sclera: Conjunctivae normal.  Cardiovascular:     Rate and Rhythm: Normal rate and regular rhythm.     Heart sounds: Normal heart sounds.  Pulmonary:     Effort: Pulmonary effort is normal. No respiratory distress.     Breath sounds: Normal breath sounds. No wheezing, rhonchi or rales.  Musculoskeletal:     Cervical back: Neck supple.  Skin:    General: Skin is dry.  Neurological:     General: No focal deficit present.     Mental Status: She is alert. Mental status is at baseline.     Motor: No weakness.     Gait: Gait normal.  Psychiatric:        Mood and Affect: Mood normal.        Behavior: Behavior normal.         Thought Content: Thought content normal.     UC Treatments / Results  Labs (all labs ordered are listed, but only  abnormal results are displayed) Labs Reviewed  RESP PANEL BY RT-PCR (FLU A&B, COVID) [id number]    EKG   Radiology No results found.  Procedures Procedures (including critical care time)  Medications Ordered in UC Medications - No data to display  Initial Impression / Assessment and Plan / UC Course  I have reviewed the triage vital signs and the nursing notes.  Pertinent labs & imaging results that were available during my care of the patient were reviewed by me and considered in my medical decision making (see chart for details).  61 year old female presenting for 2 to 3-day history of cough and congestion with temps up to 100.7 degrees.  Patient says she is most bothered by her cough which is mostly nonproductive.  She denies any associated sinus pain, chest pain or breathing difficulty.  No known exposure to COVID or flu.  Vitals are stable.  She is afebrile.  She is mildly ill-appearing.  Exam only significant for mild nasal congestion.  Chest is clear to auscultation and heart regular rate and rhythm.  COVID-19/influenza testing obtained.  Advised patient we will call her with the results.  Nursing staff to inform patient of negative respiratory panel.  Symptoms consistent with viral illness.  Supportive care encouraged.  Already sent benzonatate to pharmacy.  Advised to keep follow-up appointment with PCP tomorrow.  Increase rest and fluids.  Please return for any acute worsening of symptoms.  Final Clinical Impressions(s) / UC Diagnoses   Final diagnoses:  Viral illness  Low grade fever  Acute cough     Discharge Instructions      -We are testing for flu and COVID.  The results we back, while we will call you with the results. - If results are negative, you likely have another viral illness which will need to more time down its course.  I have sent  cough medicine for you. - If you have the flu I can send Tamiflu. - If you have COVID-19, there are antiviral medications for that as well. - At this time would advise you to rest and increase your fluid intake. - You need to be seen again if you develop a higher fever or your cough starts to become very productive, you have chest pain or breathing difficulty.     ED Prescriptions     Medication Sig Dispense Auth. Provider   benzonatate (TESSALON) 200 MG capsule Take 1 capsule (200 mg total) by mouth 3 (three) times daily as needed for cough. 30 capsule Danton Clap, PA-C      PDMP not reviewed this encounter.   Danton Clap, PA-C 12/30/21 [place], [place], PA-C 12/30/21 1536

## 2021-12-30 NOTE — Discharge Instructions (Addendum)
-  We are testing for flu and COVID.  The results we back, while we will call you with the results. - If results are negative, you likely have another viral illness which will need to more time down its course.  I have sent cough medicine for you. - If you have the flu I can send Tamiflu. - If you have COVID-19, there are antiviral medications for that as well. - At this time would advise you to rest and increase your fluid intake. - You need to be seen again if you develop a higher fever or your cough starts to become very productive, you have chest pain or breathing difficulty.

## 2022-03-27 ENCOUNTER — Other Ambulatory Visit: Payer: Self-pay | Admitting: Family Medicine

## 2022-03-27 DIAGNOSIS — Z1231 Encounter for screening mammogram for malignant neoplasm of breast: Secondary | ICD-10-CM

## 2022-05-06 ENCOUNTER — Ambulatory Visit: Payer: Self-pay

## 2022-05-06 ENCOUNTER — Ambulatory Visit
Admission: RE | Admit: 2022-05-06 | Discharge: 2022-05-06 | Disposition: A | Discharge status: Home / Self Care | Payer: Commercial Managed Care - HMO | Source: Ambulatory Visit | Attending: Family Medicine | Admitting: Family Medicine

## 2022-05-06 DIAGNOSIS — Z1231 Encounter for screening mammogram for malignant neoplasm of breast: Secondary | ICD-10-CM | POA: Diagnosis not present

## 2022-05-08 ENCOUNTER — Other Ambulatory Visit: Payer: Self-pay | Admitting: Family Medicine

## 2022-05-08 DIAGNOSIS — N63 Unspecified lump in unspecified breast: Secondary | ICD-10-CM

## 2022-05-08 DIAGNOSIS — R928 Other abnormal and inconclusive findings on diagnostic imaging of breast: Secondary | ICD-10-CM

## 2022-05-16 IMAGING — CR DG SHOULDER 2+V*R*
3 series · 3 of 3 positions shown · non-contrast
Comparison: None.

CLINICAL DATA: Fall 1 week ago. Persistent right shoulder pain.
Initial encounter.

EXAM:
RIGHT SHOULDER - 2+ VIEW

[shoulder grashey]
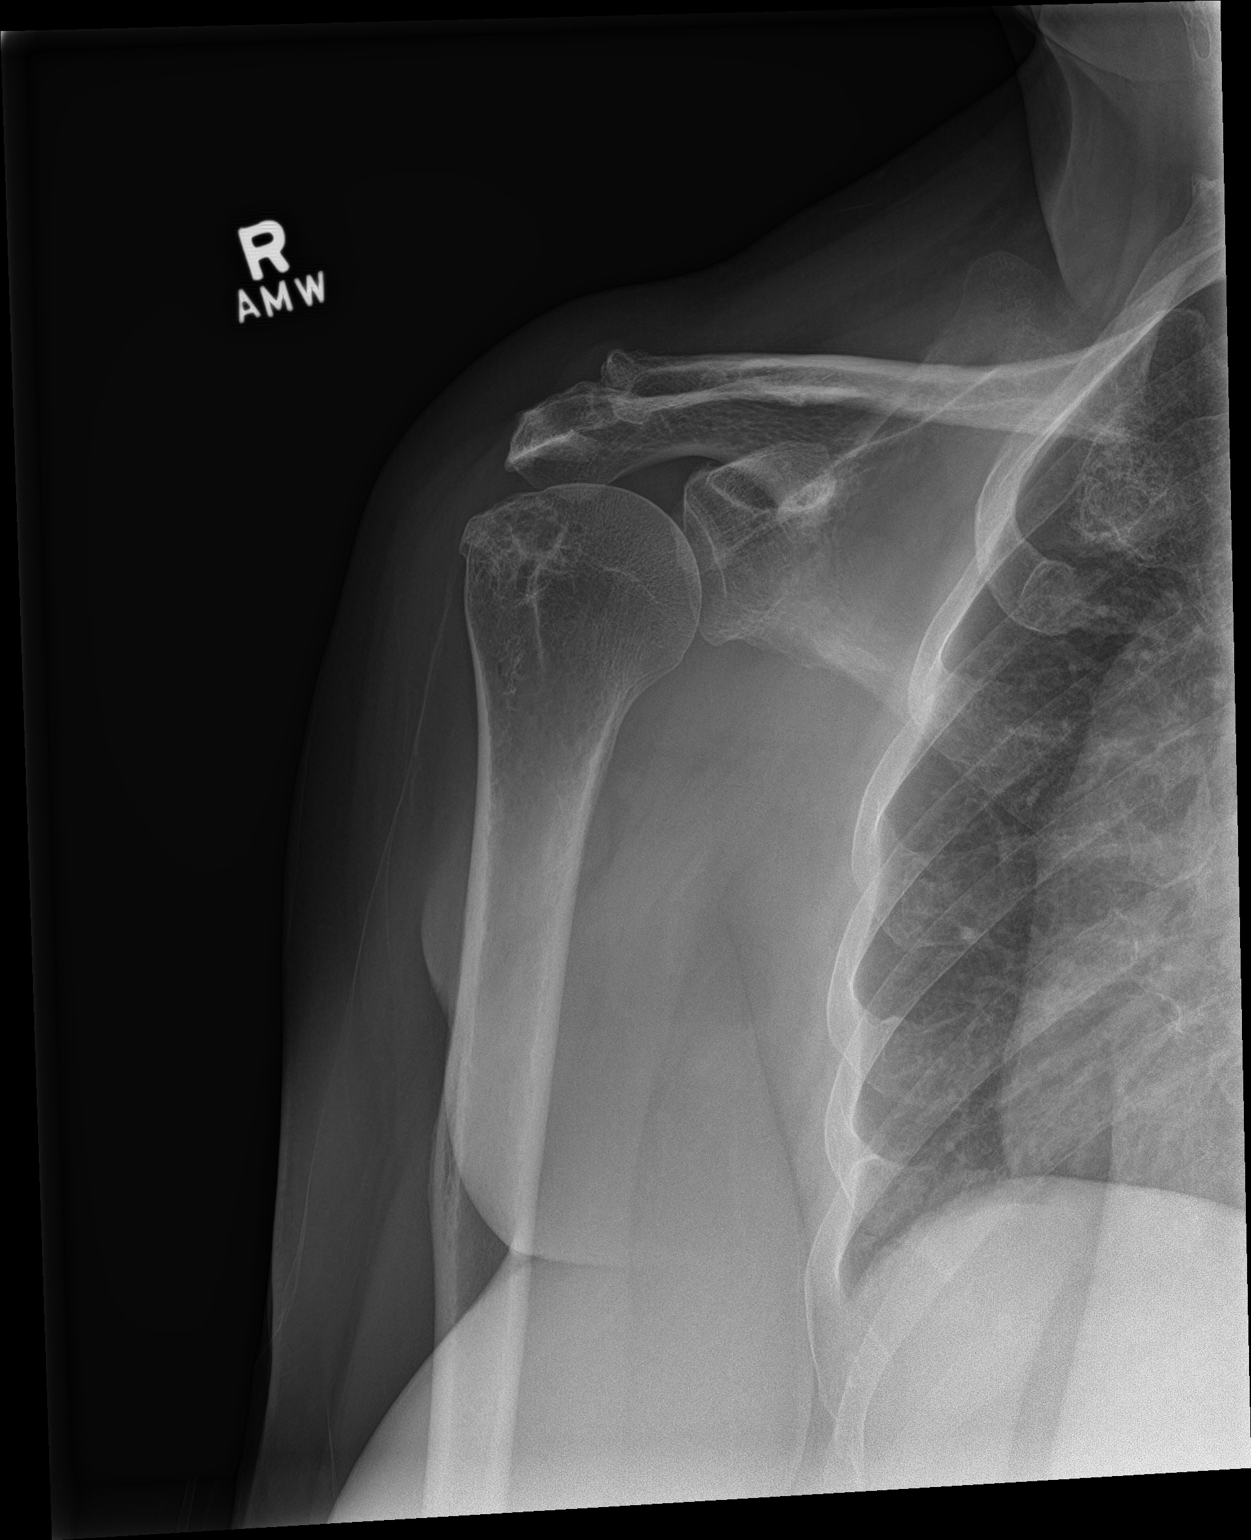

[shoulder y view]
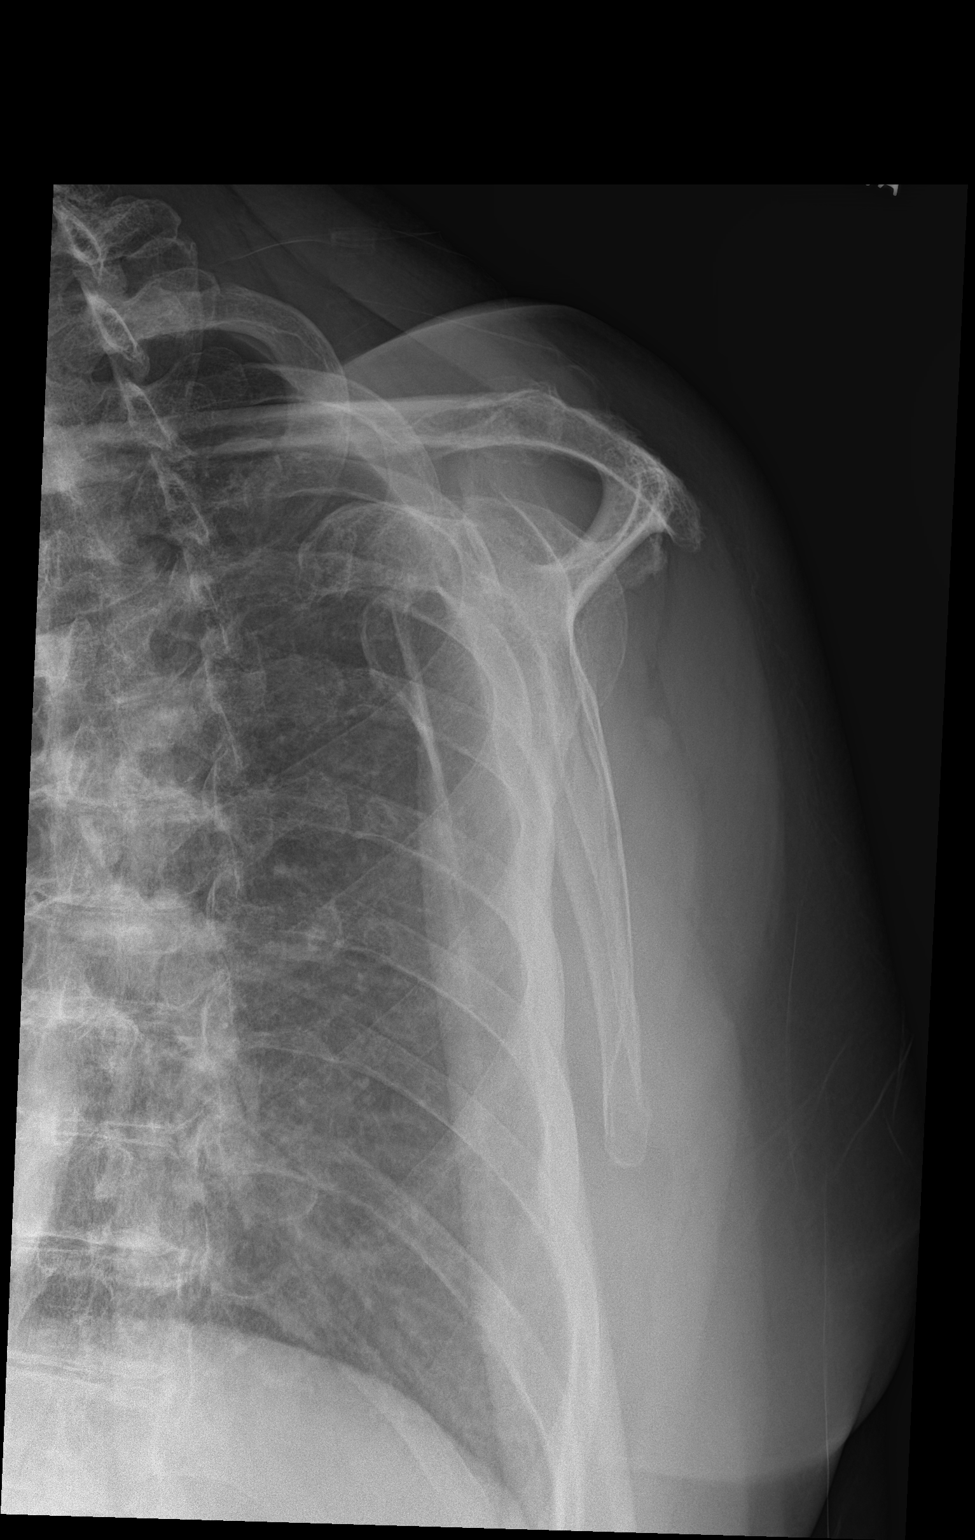

[shoulder axial]
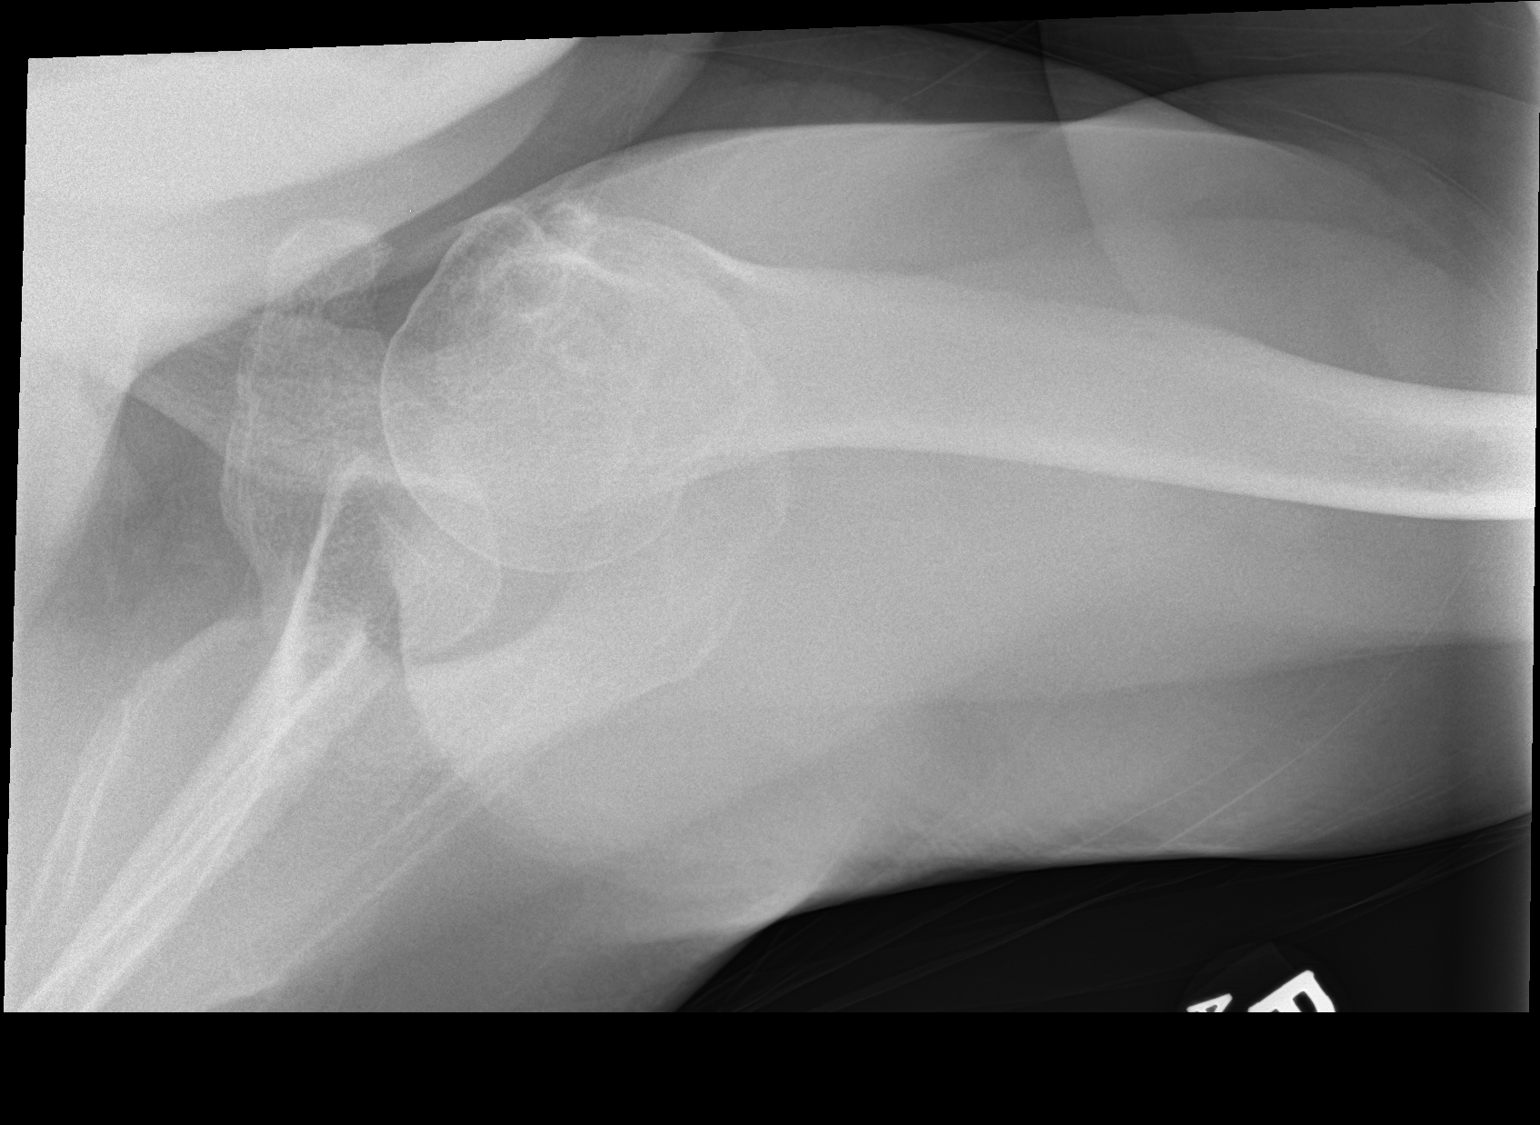

[3 of 3 positions shown; findings below may reference images not displayed]

FINDINGS: There is no evidence of fracture or dislocation. Degenerative
spurring is seen involving the acromioclavicular joint. Degenerative
spurring is also seen involving the greater tuberosity of the
humeral head. No other focal bone lesions identified. Soft tissues
are unremarkable.
IMPRESSION: No acute findings.

Degenerative changes, as described above.

## 2022-06-02 ENCOUNTER — Ambulatory Visit
Admission: RE | Admit: 2022-06-02 | Discharge: 2022-06-02 | Disposition: A | Discharge status: Home / Self Care | Payer: Commercial Managed Care - HMO | Source: Ambulatory Visit | Attending: Family Medicine | Admitting: Family Medicine

## 2022-06-02 ENCOUNTER — Ambulatory Visit
Admission: RE | Admit: 2022-06-02 | Discharge: 2022-06-02 | Disposition: A | Discharge status: Home / Self Care | Payer: Commercial Managed Care - HMO | Source: Ambulatory Visit

## 2022-06-02 ENCOUNTER — Other Ambulatory Visit: Payer: Self-pay | Admitting: Family Medicine

## 2022-06-02 DIAGNOSIS — N6324 Unspecified lump in the left breast, lower inner quadrant: Secondary | ICD-10-CM

## 2022-06-02 DIAGNOSIS — R928 Other abnormal and inconclusive findings on diagnostic imaging of breast: Secondary | ICD-10-CM | POA: Insufficient documentation

## 2022-06-02 DIAGNOSIS — N63 Unspecified lump in unspecified breast: Secondary | ICD-10-CM | POA: Diagnosis present

## 2022-06-15 ENCOUNTER — Ambulatory Visit: Payer: Commercial Managed Care - HMO | Admitting: Urology

## 2022-06-15 ENCOUNTER — Encounter: Payer: Self-pay | Admitting: Urology

## 2022-06-15 VITALS — BP 154/83 | HR 73 | Ht 66.0 in | Wt 220.0 lb

## 2022-06-15 DIAGNOSIS — N3941 Urge incontinence: Secondary | ICD-10-CM

## 2022-06-15 LAB — URINALYSIS, COMPLETE
Bilirubin, UA: NEGATIVE
Ketones, UA: NEGATIVE
Leukocytes,UA: NEGATIVE
Nitrite, UA: NEGATIVE
Protein,UA: NEGATIVE
Specific Gravity, UA: 1.025 (ref 1.005–1.030)
Urobilinogen, Ur: 0.2 mg/dL (ref 0.2–1.0)
pH, UA: 5.5 (ref 5.0–7.5)

## 2022-06-15 LAB — MICROSCOPIC EXAMINATION

## 2022-06-15 MED ORDER — MIRABEGRON ER 25 MG PO TB24: 25.0000 mg | ORAL_TABLET | Freq: Every day | ORAL | 11 refills | Status: AC

## 2022-06-15 MED ORDER — MIRABEGRON ER 25 MG PO TB24: 25.0000 mg | ORAL_TABLET | Freq: Every day | ORAL | 0 refills | Status: DC

## 2022-06-15 NOTE — Addendum Note (Signed)
Addended by: Chrystie Nose on: 06/15/2022 03:35 PM   Modules accepted: Orders

## 2022-06-15 NOTE — Progress Notes (Signed)
06/15/2022 2:12 PM   Jacquelynn Cree March 09, 1961 [id number]  Referring provider: Donnie Coffin, MD [place],  [place] [address]  Chief Complaint  Patient presents with   Urinary Incontinence    HPI: Consulted to assist the patient for bedwetting.  She is tried medications in the past.  She gets up 2-3 times a night.  She has rare urge incontinence during the day.  The bedwetting can be moderately high-volume.  She voids every 2-3 hours during the day.  She might rarely leak a small amount with coughing sneezing  She is on oral hypoglycemics and she has had a hysterectomy  She denies history of kidney stones bladder surgery and recurrent bladder infections   PMH: Past Medical History:  Diagnosis Date   Anxiety    Diabetes mellitus without complication ([place])    Headache    stress - several times per week   Heart murmur    Hypertension    Macular degeneration     Surgical History: Past Surgical History:  Procedure Laterality Date   ABDOMINAL HYSTERECTOMY  2010   COLONOSCOPY WITH PROPOFOL N/A 05/18/2018   Procedure: COLONOSCOPY WITH PROPOFOL;  Surgeon: Lin Landsman, MD;  Location: [facility];  Service: Endoscopy;  Laterality: N/A;  Diabetic - oral meds   OOPHORECTOMY     POLYPECTOMY N/A 05/18/2018   Procedure: POLYPECTOMY;  Surgeon: Lin Landsman, MD;  Location: [facility];  Service: Endoscopy;  Laterality: N/A;    Home Medications:  Allergies as of 06/15/2022   No Known Allergies      Medication List        Accurate as of June 15, 2022  2:12 PM. If you have any questions, ask your nurse or doctor.          amLODipine 5 MG tablet Commonly known as: NORVASC Take 5 mg by mouth.   atorvastatin 20 MG tablet Commonly known as: LIPITOR Take 20 mg by mouth daily.   benzonatate 200 MG capsule Commonly known as: TESSALON Take 1 capsule (200 mg total) by mouth 3 (three) times daily as needed for cough.   FISH  OIL PO Take by mouth daily.   lisinopril 40 MG tablet Commonly known as: ZESTRIL TAKE 1 TABLET BY MOUTH EVERY DAY   metFORMIN 500 MG tablet Commonly known as: GLUCOPHAGE Take 500 mg by mouth.   multivitamin tablet Take 1 tablet by mouth daily.   PARoxetine 40 MG tablet Commonly known as: PAXIL Take 40 mg by mouth.        Allergies: No Known Allergies  Family History: Family History  Adopted: Yes    Social History:  reports that she has quit smoking. She has never used smokeless tobacco. She reports that she does not currently use alcohol. She reports that she does not use drugs.  ROS:                                        Physical Exam: LMP 06/03/2009 (Approximate)   Constitutional:  Alert and oriented, No acute distress. HEENT: [place] AT, moist mucus membranes.  Trachea midline, no masses. Cardiovascular: No clubbing, cyanosis, or edema. Respiratory: Normal respiratory effort, no increased work of breathing. GI: Abdomen is soft, nontender, nondistended, no abdominal masses GU: Mild grade 2 hypermobility bladder neck no stress incontinence.  She had some cystic areas in her inner thighs  as noted by the gynecology service in [place]. Skin: No rashes, bruises or suspicious lesions. Lymph: No cervical or inguinal adenopathy. Neurologic: Grossly intact, no focal deficits, moving all 4 extremities. Psychiatric: Normal mood and affect.  Laboratory Data: No results found for: "WBC", "HGB", "HCT", "MCV", "PLT"  No results found for: "CREATININE"  No results found for: "PSA"  No results found for: "TESTOSTERONE"  No results found for: "HGBA1C"  Urinalysis No results found for: "COLORURINE", "APPEARANCEUR", "LABSPEC", "PHURINE", "GLUCOSEU", "HGBUR", "BILIRUBINUR", "KETONESUR", "PROTEINUR", "UROBILINOGEN", "NITRITE", "LEUKOCYTESUR"  Pertinent Imaging:   Assessment & Plan: Patient has primarily urge incontinence.  She has no ankle edema.   She has minimal frequency and rare urge incontinence.  She has rare stress incontinence.  Reevaluate for cystoscopy in approximately 6 weeks on Myrbetriq 25 mg samples and prescription.  I mention urodynamics without detail  . Urge incontinence  - Urinalysis, Complete   No follow-ups on file.  Reece Packer, MD  [facility] [address], [place],  [address] [phone]

## 2022-06-15 NOTE — Addendum Note (Signed)
Addended by: Chrystie Nose on: 06/15/2022 03:40 PM   Modules accepted: Orders

## 2022-07-07 ENCOUNTER — Encounter: Payer: Self-pay | Admitting: Urology

## 2022-07-27 ENCOUNTER — Other Ambulatory Visit: Payer: Commercial Managed Care - HMO | Admitting: Urology

## 2022-08-31 ENCOUNTER — Ambulatory Visit (INDEPENDENT_AMBULATORY_CARE_PROVIDER_SITE_OTHER): Payer: Commercial Managed Care - HMO | Admitting: Urology

## 2022-08-31 ENCOUNTER — Encounter: Payer: Self-pay | Admitting: Urology

## 2022-08-31 VITALS — BP 149/78 | HR 81 | Ht 66.0 in | Wt 216.6 lb

## 2022-08-31 DIAGNOSIS — N3941 Urge incontinence: Secondary | ICD-10-CM | POA: Diagnosis not present

## 2022-08-31 LAB — MICROSCOPIC EXAMINATION: Epithelial Cells (non renal): >10 /hpf — AB (ref 0–10)

## 2022-08-31 LAB — URINALYSIS, COMPLETE
Bilirubin, UA: NEGATIVE
Glucose, UA: NEGATIVE
Ketones, UA: NEGATIVE
Nitrite, UA: NEGATIVE
Protein,UA: NEGATIVE
RBC, UA: NEGATIVE
Specific Gravity, UA: 1.025 (ref 1.005–1.030)
Urobilinogen, Ur: 0.2 mg/dL (ref 0.2–1.0)
pH, UA: 5.5 (ref 5.0–7.5)

## 2022-08-31 MED ORDER — LIDOCAINE HCL URETHRAL/MUCOSAL 2 % EX GEL
1.0000 | Freq: Once | CUTANEOUS | Status: AC
  Administered 2022-08-31: 1 via URETHRAL

## 2022-08-31 NOTE — Progress Notes (Signed)
08/31/2022 9:06 AM   Diane Logan 1961-06-30 [id number]  Referring provider: Donnie Coffin, MD [place],  [place] [address]  Chief Complaint  Patient presents with   Cysto    HPI: I was consulted to assist the patient for bedwetting.  She is tried medications in the past.  She gets up 2-3 times a night.  She has rare urge incontinence during the day.  The bedwetting can be moderately high-volume.  She voids every 2-3 hours during the day.  She might rarely leak a small amount with coughing sneezing   She is on oral hypoglycemics and she has had a hysterectomy  Mild grade 2 hypermobility bladder neck no stress incontinence. She had some cystic areas in her inner thighs as noted by the gynecology service in [place].   Patient has primarily urge incontinence.  She has no ankle edema.  She has minimal frequency and rare urge incontinence.  She has rare stress incontinence.  Reevaluate for cystoscopy in approximately 6 weeks on Myrbetriq 25 mg samples and prescription.  I mention urodynamics without detail   Today Frequency stable.  1 pad during the day for mild stress incontinence.  Still has 2 heavy pads per night bedwetting and failed Myrbetriq.  Clinically not infected  Cystoscopy: Patient underwent flexible cystoscopy.  Bladder mucosa and trigone were normal.  There was a few white flecks in the urine I sent for culture.  Patient tolerated procedure well       PMH: Past Medical History:  Diagnosis Date   Anxiety    Diabetes mellitus without complication ([place])    Headache    stress - several times per week   Heart murmur    Hypertension    Macular degeneration     Surgical History: Past Surgical History:  Procedure Laterality Date   ABDOMINAL HYSTERECTOMY  2010   COLONOSCOPY WITH PROPOFOL N/A 05/18/2018   Procedure: COLONOSCOPY WITH PROPOFOL;  Surgeon: Lin Landsman, MD;  Location: [facility];  Service: Endoscopy;  Laterality:  N/A;  Diabetic - oral meds   OOPHORECTOMY     POLYPECTOMY N/A 05/18/2018   Procedure: POLYPECTOMY;  Surgeon: Lin Landsman, MD;  Location: [facility];  Service: Endoscopy;  Laterality: N/A;    Home Medications:  Allergies as of 08/31/2022   No Known Allergies      Medication List        Accurate as of August 31, 2022  9:06 AM. If you have any questions, ask your nurse or doctor.          amLODipine 5 MG tablet Commonly known as: NORVASC Take 5 mg by mouth.   atorvastatin 20 MG tablet Commonly known as: LIPITOR Take 20 mg by mouth daily.   benzonatate 200 MG capsule Commonly known as: TESSALON Take 1 capsule (200 mg total) by mouth 3 (three) times daily as needed for cough.   FISH OIL PO Take by mouth daily.   lisinopril 40 MG tablet Commonly known as: ZESTRIL TAKE 1 TABLET BY MOUTH EVERY DAY   metFORMIN 500 MG tablet Commonly known as: GLUCOPHAGE Take 500 mg by mouth.   mirabegron ER 25 MG Tb24 tablet Commonly known as: MYRBETRIQ Take 1 tablet (25 mg total) by mouth daily.   mirabegron ER 25 MG Tb24 tablet Commonly known as: MYRBETRIQ Take 1 tablet (25 mg total) by mouth daily.   multivitamin tablet Take 1 tablet by mouth daily.   PARoxetine 40 MG  tablet Commonly known as: PAXIL Take 40 mg by mouth.        Allergies: No Known Allergies  Family History: Family History  Adopted: Yes    Social History:  reports that she has quit smoking. She has never used smokeless tobacco. She reports that she does not currently use alcohol. She reports that she does not use drugs.  ROS:                                        Physical Exam: LMP 06/03/2009 (Approximate)   Constitutional:  Alert and oriented, No acute distress. HEENT: [place] AT, moist mucus membranes.  Trachea midline, no masses.   Laboratory Data: No results found for: "WBC", "HGB", "HCT", "MCV", "PLT"  No results found for: "CREATININE"  No results  found for: "PSA"  No results found for: "TESTOSTERONE"  No results found for: "HGBA1C"  Urinalysis    Component Value Date/Time   APPEARANCEUR Clear 06/15/2022 1417   GLUCOSEU 2+ (A) 06/15/2022 1417   BILIRUBINUR Negative 06/15/2022 1417   PROTEINUR Negative 06/15/2022 1417   NITRITE Negative 06/15/2022 1417   LEUKOCYTESUR Negative 06/15/2022 1417    Pertinent Imaging:   Assessment & Plan: Has had bedwetting high-volume for approximately 2 years.  She will return with urodynamics and we will proceed accordingly  1. Urge incontinence  - Urinalysis, Complete   No follow-ups on file.  Reece Packer, MD  [facility] [address], [place], [place] [address] [phone]

## 2022-09-04 ENCOUNTER — Telehealth: Payer: Self-pay | Admitting: *Deleted

## 2022-09-04 LAB — CULTURE, URINE COMPREHENSIVE

## 2022-09-04 MED ORDER — SULFAMETHOXAZOLE-TRIMETHOPRIM 800-160 MG PO TABS: 1.0000 | ORAL_TABLET | Freq: Two times a day (BID) | ORAL | 0 refills | Status: AC

## 2022-09-04 NOTE — Telephone Encounter (Signed)
rx sent to pharmacy by e-script .left message to have patient return my call.  

## 2022-09-04 NOTE — Telephone Encounter (Signed)
-----  Message from Bjorn Loser, MD sent at 09/04/2022 12:11 PM EST ----- Septra DS 1 tablet twice a day for 7 days ----- Message ----- From: Despina Hidden, CMA Sent: 09/02/2022   9:41 AM EST To: Bjorn Loser, MD   ----- Message ----- From: Interface, [facility] Lab Results In Sent: 08/31/2022  11:36 AM EST To: [facility]

## 2022-10-05 ENCOUNTER — Ambulatory Visit: Payer: Commercial Managed Care - HMO | Admitting: Urology

## 2022-10-05 ENCOUNTER — Encounter: Payer: Self-pay | Admitting: Urology

## 2022-11-02 ENCOUNTER — Ambulatory Visit: Payer: Commercial Managed Care - HMO | Admitting: Urology

## 2022-11-02 VITALS — BP 142/80 | HR 69 | Ht 66.0 in | Wt 216.0 lb

## 2022-11-02 DIAGNOSIS — N3941 Urge incontinence: Secondary | ICD-10-CM

## 2022-11-02 LAB — URINALYSIS, COMPLETE
Bilirubin, UA: NEGATIVE
Ketones, UA: NEGATIVE
Leukocytes,UA: NEGATIVE
Nitrite, UA: NEGATIVE
Protein,UA: NEGATIVE
RBC, UA: NEGATIVE
Specific Gravity, UA: >1.03 — ABNORMAL HIGH (ref 1.005–1.030)
Urobilinogen, Ur: 0.2 mg/dL (ref 0.2–1.0)
pH, UA: 5 (ref 5.0–7.5)

## 2022-11-02 LAB — MICROSCOPIC EXAMINATION: Epithelial Cells (non renal): >10 /hpf — AB (ref 0–10)

## 2022-11-02 MED ORDER — GEMTESA 75 MG PO TABS: 75.0000 mg | ORAL_TABLET | Freq: Every day | ORAL | 0 refills | Status: AC

## 2022-11-02 NOTE — Progress Notes (Signed)
11/02/2022 3:07 PM   Diane Logan 1961/01/04 [id number]  Referring provider: Donnie Coffin, MD [place],  [place] [address]  Chief Complaint  Patient presents with   Follow-up    UDS results     HPI: I was consulted to assist the patient for bedwetting.  She is tried medications in the past.  She gets up 2-3 times a night.  She has rare urge incontinence during the day.  The bedwetting can be moderately high-volume.  She voids every 2-3 hours during the day.  She might rarely leak a small amount with coughing sneezing   She is on oral hypoglycemics and she has had a hysterectomy   Mild grade 2 hypermobility bladder neck no stress incontinence. She had some cystic areas in her inner thighs as noted by the gynecology service in [place].    Patient has primarily urge incontinence.  She has no ankle edema.  She has minimal frequency and rare urge incontinence.  She has rare stress incontinence.  Reevaluate for cystoscopy in approximately 6 weeks on Myrbetriq 25 mg samples and prescription.    1 pad during the day for mild stress incontinence.  Still has 2 heavy pads per night bedwetting and failed Myrbetriq.  Clinically not infected   Cystoscopy: Patient underwent flexible cystoscopy.  Bladder mucosa and trigone were normal.  There was a few white flecks in the urine I sent for culture.  Patient tolerated procedure well  Has had bedwetting high-volume for approximately 2 years.  She will return with urodynamics and we will proceed accordingly   Today Frequency stable.  Last culture was positive.  Bedwetting persistent On urodynamics she voided 20 mL with a maximal flow 4 mL/s and emptied efficiently.  Maximal bladder capacity was 142 mL.  Bladder was unstable leaking a moderate amount.  Pressure reached 15 cm of water.  Cough leak point pressure at 150 mL was 78 cmH2O with mild leakage.  Valsalva leak point pressure at the same volume was 110 cm water.  During  voluntary voiding she voided 92 mL max flow of 5 mL/s.  Max voiding pressure 49 cm of water.  It was a prolonged intermittent pattern.  She was also doing some straining.  Residual was 56 mL.  EMG normal during voiding.  The small cystocele on urodynamics with mild trabeculation.  Bladder neck distended 1 to 2 cm         PMH: Past Medical History:  Diagnosis Date   Anxiety    Diabetes mellitus without complication ([place])    Headache    stress - several times per week   Heart murmur    Hypertension    Macular degeneration     Surgical History: Past Surgical History:  Procedure Laterality Date   ABDOMINAL HYSTERECTOMY  2010   COLONOSCOPY WITH PROPOFOL N/A 05/18/2018   Procedure: COLONOSCOPY WITH PROPOFOL;  Surgeon: Lin Landsman, MD;  Location: [facility];  Service: Endoscopy;  Laterality: N/A;  Diabetic - oral meds   OOPHORECTOMY     POLYPECTOMY N/A 05/18/2018   Procedure: POLYPECTOMY;  Surgeon: Lin Landsman, MD;  Location: [facility];  Service: Endoscopy;  Laterality: N/A;    Home Medications:  Allergies as of 11/02/2022   No Known Allergies      Medication List        Accurate as of November 02, 2022  3:07 PM. If you have any questions, ask your nurse or doctor.  amLODipine 10 MG tablet Commonly known as: NORVASC Take 10 mg by mouth daily.   atorvastatin 20 MG tablet Commonly known as: LIPITOR Take 20 mg by mouth daily.   FISH OIL PO Take by mouth daily.   lisinopril 40 MG tablet Commonly known as: ZESTRIL TAKE 1 TABLET BY MOUTH EVERY DAY   metFORMIN 500 MG tablet Commonly known as: GLUCOPHAGE Take 500 mg by mouth.   mirabegron ER 25 MG Tb24 tablet Commonly known as: MYRBETRIQ Take 1 tablet (25 mg total) by mouth daily.   multivitamin tablet Take 1 tablet by mouth daily.   PARoxetine 40 MG tablet Commonly known as: PAXIL Take 40 mg by mouth.        Allergies: No Known Allergies  Family History: Family  History  Adopted: Yes    Social History:  reports that she has quit smoking. She has never used smokeless tobacco. She reports that she does not currently use alcohol. She reports that she does not use drugs.  ROS:                                        Physical Exam: LMP 06/03/2009 (Approximate)   Constitutional:  Alert and oriented, No acute distress. HEENT: [place] AT, moist mucus membranes.  Trachea midline, no masses.   Laboratory Data: No results found for: "WBC", "HGB", "HCT", "MCV", "PLT"  No results found for: "CREATININE"  No results found for: "PSA"  No results found for: "TESTOSTERONE"  No results found for: "HGBA1C"  Urinalysis    Component Value Date/Time   APPEARANCEUR Hazy (A) 08/31/2022 0908   GLUCOSEU Negative 08/31/2022 0908   BILIRUBINUR Negative 08/31/2022 0908   PROTEINUR Negative 08/31/2022 0908   NITRITE Negative 08/31/2022 0908   LEUKOCYTESUR Trace (A) 08/31/2022 0908    Pertinent Imaging:   Assessment & Plan: Patient's bedwetting is due to her overactivity.  She has mild stress incontinence by history and urodynamics.  She has failed medications in the distant past and Myrbetriq.  Reassess on [place]  Then discussed third line therapies.  She does have high-volume already and pressure and very low bladder capacity.  She did not have other evidence of neurogenic bladder dysfunction.  Understands that there is 3 refractory treatments but I really want her to try Gemtesa.  Will call her with the samples and reassess in about 6 weeks and discuss refractory treatments and if needed.  Call if culture positive  1. Urge incontinence  - Urinalysis, Complete   No follow-ups on file.  Reece Packer, MD  [facility] [address], [place],  [address] [phone]

## 2022-11-05 LAB — CULTURE, URINE COMPREHENSIVE

## 2022-11-09 ENCOUNTER — Encounter: Payer: Self-pay | Admitting: Urology

## 2022-11-10 ENCOUNTER — Encounter: Payer: Self-pay | Admitting: Urology

## 2022-12-21 ENCOUNTER — Ambulatory Visit: Payer: Commercial Managed Care - HMO | Admitting: Urology

## 2023-08-06 ENCOUNTER — Ambulatory Visit (INDEPENDENT_AMBULATORY_CARE_PROVIDER_SITE_OTHER): Payer: 59

## 2023-08-06 ENCOUNTER — Encounter: Payer: Self-pay | Admitting: Emergency Medicine

## 2023-08-06 ENCOUNTER — Ambulatory Visit
Admission: EM | Admit: 2023-08-06 | Discharge: 2023-08-06 | Disposition: A | Discharge status: Home / Self Care | Payer: 59 | Attending: Family Medicine | Admitting: Family Medicine

## 2023-08-06 DIAGNOSIS — L03116 Cellulitis of left lower limb: Secondary | ICD-10-CM | POA: Insufficient documentation

## 2023-08-06 DIAGNOSIS — M25532 Pain in left wrist: Secondary | ICD-10-CM | POA: Insufficient documentation

## 2023-08-06 DIAGNOSIS — M7662 Achilles tendinitis, left leg: Secondary | ICD-10-CM | POA: Diagnosis not present

## 2023-08-06 DIAGNOSIS — M8580 Other specified disorders of bone density and structure, unspecified site: Secondary | ICD-10-CM | POA: Diagnosis not present

## 2023-08-06 DIAGNOSIS — M25572 Pain in left ankle and joints of left foot: Secondary | ICD-10-CM

## 2023-08-06 DIAGNOSIS — M7989 Other specified soft tissue disorders: Secondary | ICD-10-CM | POA: Diagnosis not present

## 2023-08-06 DIAGNOSIS — M19072 Primary osteoarthritis, left ankle and foot: Secondary | ICD-10-CM | POA: Diagnosis not present

## 2023-08-06 LAB — CBC WITH DIFFERENTIAL/PLATELET
Abs Immature Granulocytes: 0.04 10*3/uL (ref 0.00–0.07)
Basophils Absolute: 0 10*3/uL (ref 0.0–0.1)
Basophils Relative: 0 %
Eosinophils Absolute: 0.3 10*3/uL (ref 0.0–0.5)
Eosinophils Relative: 2 %
HCT: 37.6 % (ref 36.0–46.0)
Hemoglobin: 12.5 g/dL (ref 12.0–15.0)
Immature Granulocytes: 0 %
Lymphocytes Relative: 10 %
Lymphs Abs: 1.1 10*3/uL (ref 0.7–4.0)
MCH: 27.2 pg (ref 26.0–34.0)
MCHC: 33.2 g/dL (ref 30.0–36.0)
MCV: 81.7 fL (ref 80.0–100.0)
Monocytes Absolute: 0.8 10*3/uL (ref 0.1–1.0)
Monocytes Relative: 7 %
Neutro Abs: 9.5 10*3/uL — ABNORMAL HIGH (ref 1.7–7.7)
Neutrophils Relative %: 81 %
Platelets: 289 10*3/uL (ref 150–400)
RBC: 4.6 MIL/uL (ref 3.87–5.11)
RDW: 12.9 % (ref 11.5–15.5)
WBC: 11.8 10*3/uL — ABNORMAL HIGH (ref 4.0–10.5)
nRBC: 0 % (ref 0.0–0.2)

## 2023-08-06 LAB — URIC ACID: Uric Acid, Serum: 3.6 mg/dL (ref 2.5–7.1)

## 2023-08-06 MED ORDER — DOXYCYCLINE HYCLATE 100 MG PO CAPS: 100.0000 mg | ORAL_CAPSULE | Freq: Two times a day (BID) | ORAL | 0 refills | Status: DC

## 2023-08-06 MED ORDER — IBUPROFEN 800 MG PO TABS
800.0000 mg | ORAL_TABLET | Freq: Once | ORAL | Status: AC
  Administered 2023-08-06: 800 mg via ORAL

## 2023-08-06 MED ORDER — PREDNISONE 10 MG (21) PO TBPK: ORAL_TABLET | Freq: Every day | ORAL | 0 refills | Status: DC

## 2023-08-06 MED ORDER — CEFTRIAXONE SODIUM 1 G IJ SOLR
1.0000 g | Freq: Once | INTRAMUSCULAR | Status: AC
  Administered 2023-08-06: 1 g via INTRAMUSCULAR

## 2023-08-06 NOTE — ED Triage Notes (Signed)
Patient c/o left wrist pain and swelling and left ankle/foot pain and swelling that started 4 days ago.  Patient does have low grade fever of 100.2 today.

## 2023-08-06 NOTE — Discharge Instructions (Addendum)
Your x-rays did not show evidence of bone infection, fracture or dislocation.  Your white blood cell count was elevated which supports the idea that you could have cellulitis.  You are given a shot of antibiotics here today.  Start your antibiotics tomorrow.  I also prescribed you some prednisone.  Monitor your blood pressures and blood sugars while taking this medication.  Take Tylenol and/or ibuprofen as needed for pain.  Keep your foot elevated while at rest.  Follow-up with [facility] either in [place] or [place], if symptoms do not improve.

## 2023-08-06 NOTE — ED Provider Notes (Signed)
[facility]    CSN: [id number] Arrival date & time: 08/06/23  1616      History   Chief Complaint Chief Complaint  Patient presents with   Wrist Pain    left   Ankle Pain    left    HPI  HPI Diane Logan is a 62 y.o. female.   Diane Logan presents for left wrist and left ankle pain that started 4 days ago.  No fevers at home but she found out she had a low-grade fever when she arrived.  Denies any injury, falls or trauma.  She has not had anything like this before.  Her left ankle is red and swollen as well.  Her left wrist is red and warm.  Denies anything biting her.  There is been no history of autoimmune arthritis in herself or her family.  No history of gout.  Past Medical History:  Diagnosis Date   Anxiety    Diabetes mellitus without complication ([facility])    Headache    stress - several times per week   Heart murmur    Hypertension    Macular degeneration     Patient Active Problem List   Diagnosis Date Noted   Murmur 08/23/2018   Right bundle branch block 08/23/2018   Encounter for screening colonoscopy    Anxiety 09/17/2017   Type 2 diabetes mellitus without complication, without long-term current use of insulin (HCC) 09/17/2017   Hyperlipidemia, unspecified 09/17/2017   Hypertension 09/17/2017   Snoring 12/14/2016   OAB (overactive bladder) 12/04/2016    Past Surgical History:  Procedure Laterality Date   ABDOMINAL HYSTERECTOMY  2010   COLONOSCOPY WITH PROPOFOL N/A 05/18/2018   Procedure: COLONOSCOPY WITH PROPOFOL;  Surgeon: Toney Reil, MD;  [facility];  Service: Endoscopy;  Laterality: N/A;  Diabetic - oral meds   OOPHORECTOMY     POLYPECTOMY N/A 05/18/2018   Procedure: POLYPECTOMY;  Surgeon: Toney Reil, MD;  Location: [facility];  Service: Endoscopy;  Laterality: N/A;    OB History   No obstetric history on file.      Home Medications    Prior to Admission medications   Medication Sig Start  Date End Date Taking? Authorizing Provider  doxycycline (VIBRAMYCIN) 100 MG capsule Take 1 capsule (100 mg total) by mouth 2 (two) times daily. 08/06/23  Yes Girtie Wiersma, DO  predniSONE (STERAPRED UNI-PAK 21 TAB) 10 MG (21) TBPK tablet Take by mouth daily. Take 6 tabs by mouth daily for 1, then 5 tabs for 1 day, then 4 tabs for 1 day, then 3 tabs for 1 day, then 2 tabs for 1 day, then 1 tab for 1 day. 08/06/23  Yes Florenda Watt, DO  amLODipine (NORVASC) 10 MG tablet Take 10 mg by mouth daily. 07/17/22   [provider]  atorvastatin (LIPITOR) 20 MG tablet Take 20 mg by mouth daily. 04/21/21   [provider]  lisinopril (PRINIVIL,ZESTRIL) 40 MG tablet TAKE 1 TABLET BY MOUTH EVERY DAY 08/27/11   [provider]  metFORMIN (GLUCOPHAGE) 500 MG tablet Take 500 mg by mouth.    [provider]  mirabegron ER (MYRBETRIQ) 25 MG TB24 tablet Take 1 tablet (25 mg total) by mouth daily. 06/15/22   Alfredo Martinez, MD  Multiple Vitamin (MULTIVITAMIN) tablet Take 1 tablet by mouth daily.    [provider]  Omega-3 Fatty Acids (FISH OIL PO) Take by mouth daily.    [provider]  PARoxetine (PAXIL)  40 MG tablet Take 40 mg by mouth.    [provider]    Family History Family History  Adopted: Yes    Social History Social History   Tobacco Use   Smoking status: Former   Smokeless tobacco: Never   Tobacco comments:    socially in 24s  Vaping Use   Vaping status: Never Used  Substance Use Topics   Alcohol use: Not Currently    Comment: 1 drink per month   Drug use: No     Allergies   Patient has no known allergies.   Review of Systems Review of Systems: :negative unless otherwise stated in HPI.      Physical Exam Triage Vital Signs ED Triage Vitals  Encounter Vitals Group     BP 08/06/23 1707 (!) 149/93     Systolic BP Percentile --      Diastolic BP Percentile --      Pulse Rate 08/06/23 1707 82     Resp 08/06/23  1707 14     Temp 08/06/23 1707 100.2 F (37.9 C)     Temp Source 08/06/23 1707 Oral     SpO2 08/06/23 1707 99 %     Weight 08/06/23 1705 216 lb 0.8 oz (98 kg)     Height 08/06/23 1705 5\' 6"  (1.676 m)     Head Circumference --      Peak Flow --      Pain Score 08/06/23 1705 9     Pain Loc --      Pain Education --      Exclude from Growth Chart --    No data found.  Updated Vital Signs BP (!) 149/93 (BP Location: Left Arm)   Pulse 82   Temp 100.2 F (37.9 C) (Oral)   Resp 14   Ht 5\' 6"  (1.676 m)   Wt 98 kg   LMP 06/03/2009 (Approximate)   SpO2 99%   BMI 34.87 kg/m   Visual Acuity Right Eye Distance:   Left Eye Distance:   Bilateral Distance:    Right Eye Near:   Left Eye Near:    Bilateral Near:     Physical Exam GEN: well appearing female in no acute distress  CVS: well perfused  RESP: speaking in full sentences without pause, no respiratory distress  MSK:  Wrist, Right: Inspection yielded +thenar sided erythematous patch with warmth with tenderness over the metacarpals, no ecchymosis, bony deformity, or swelling.  Good full with good flexion and extension and ulnar/radial deviation that is symmetrical with opposite wrist.  No scaphoid tenderness; tendons without tenderness/swelling. Strength 4+/5 in all directions without pain  Right ankle: Inspection: + erythema, edema and warmth from ankle to the midfoot. No ecchymosis or bony deformity, no bone pes planus or cavus deformity, transverse and medial arches intact Palpation: Tenderness of the bilateral malleoli ROM: Limited active and passive range of motion due to pain and swelling Strength: 5/5 in all directions No ligamentous laxity No pain at the base of the fifth metatarsal Able to ambulate though with pain  Special Tests: Negative anterior and posterior drawer, unremarkable calcaneus squeeze -Neurovascularly intact, no instability noted    UC Treatments / Results  Labs (all labs ordered are listed,  but only abnormal results are displayed) Labs Reviewed  CBC WITH DIFFERENTIAL/PLATELET - Abnormal; Notable for the following components:      Result Value   WBC 11.8 (*)    Neutro Abs 9.5 (*)    All other  components within normal limits  URIC ACID    EKG   Radiology DG Ankle Complete Left Result Date: 08/06/2023 CLINICAL DATA:  Pain, edema, redness foot and ankle pain and swelling for 4 days. EXAM: LEFT ANKLE COMPLETE - 3+ VIEW COMPARISON:  None Available. FINDINGS: No acute fracture. Suspected pes cavus. Moderate midfoot degenerative change. Large fragmented plantar calcaneal spur. Large fragmented Achilles tendon enthesophyte. Suspected subchondral cyst in the medial talar dome. No subchondral collapse. No mortise widening. Generalized subcutaneous edema. No significant joint effusion. IMPRESSION: 1. Generalized subcutaneous edema. 2. Suspected pes cavus. Moderate midfoot degenerative change. 3. Large fragmented plantar calcaneal spur and Achilles tendon enthesophyte. 4. Suspected subchondral cyst in the medial talar dome, may be degenerative or secondary to osteochondral lesion. Electronically Signed   By: Narda Rutherford M.D.   On: 08/06/2023 19:34   DG Wrist Complete Left Result Date: 08/06/2023 CLINICAL DATA:  Left wrist pain and redness. EXAM: LEFT WRIST - COMPLETE 3+ VIEW COMPARISON:  None Available. FINDINGS: There is no acute fracture or dislocation. The bones are osteopenic. Mild soft tissue swelling of the wrist. No radiopaque foreign object or soft tissue gas. IMPRESSION: 1. No acute fracture or dislocation. 2. Mild soft tissue swelling. Electronically Signed   By: Elgie Collard M.D.   On: 08/06/2023 19:34     Procedures Procedures (including critical care time)  Medications Ordered in UC Medications  ibuprofen (ADVIL) tablet 800 mg (800 mg Oral Given 08/06/23 1859)  cefTRIAXone (ROCEPHIN) injection 1 g (1 g Intramuscular Given 08/06/23 2012)    Initial Impression /  Assessment and Plan / UC Course  I have reviewed the triage vital signs and the nursing notes.  Pertinent labs & imaging results that were available during my care of the patient were reviewed by me and considered in my medical decision making (see chart for details).      Pt is a 62 y.o.  female with 4 days of left wrist, left ankle and left foot pain without known injury.  She has an elevated temperature here 100.2 F she was given 800 mg ibuprofen for fever and pain.  On exam, pt has tenderness overlying the thenar metacarpals, bilateral malleoli and midfoot concerning for possible fracture.   Obtained left foot and ankle plain films.  Personally interpreted by me were unremarkable for fracture or dislocation. Radiologist report reviewed and additionally notes  soft tissue swelling.    Recommended CBC and uric acid and patient is agreeable.  Uric acid not concerning for gout however she does have a mild leukocytosis with left shift, WBC 11.8.  I am concerned that she may have cellulitis overlying her left wrist and left ankle/foot.  As it is late in the evening and that pharmacies have closed recommended ceftriaxone injection and patient is agreeable.  Patient given 1 g ceftriaxone IM.  Prescribed doxycycline for MRSA coverage as below.  As her left foot and ankle are quite swollen also will trial a prednisone taper.  Patient to gradually return to normal activities, as tolerated and continue ordinary activities within the limits permitted by pain.  Motrin and Tylenol PRN.   Patient to follow up with orthopedic provider, if symptoms do not improve with conservative treatment.  Return and ED precautions given. Understanding voiced. Discussed MDM, treatment plan and plan for follow-up with patient who agrees with plan.   Final Clinical Impressions(s) / UC Diagnoses   Final diagnoses:  Left wrist pain  Acute left ankle pain  Cellulitis of left  lower extremity     Discharge Instructions       Your x-rays did not show evidence of bone infection, fracture or dislocation.  Your white blood cell count was elevated which supports the idea that you could have cellulitis.  You are given a shot of antibiotics here today.  Start your antibiotics tomorrow.  I also prescribed you some prednisone.  Monitor your blood pressures and blood sugars while taking this medication.  Take Tylenol and/or ibuprofen as needed for pain.  Keep your foot elevated while at rest.  Follow-up with [facility] either in [place] or [place], if symptoms do not improve.      ED Prescriptions     Medication Sig Dispense Auth. Provider   doxycycline (VIBRAMYCIN) 100 MG capsule Take 1 capsule (100 mg total) by mouth 2 (two) times daily. 20 capsule Mayari Matus, DO   predniSONE (STERAPRED UNI-PAK 21 TAB) 10 MG (21) TBPK tablet Take by mouth daily. Take 6 tabs by mouth daily for 1, then 5 tabs for 1 day, then 4 tabs for 1 day, then 3 tabs for 1 day, then 2 tabs for 1 day, then 1 tab for 1 day. 21 tablet Katha Cabal, DO      PDMP not reviewed this encounter.   Katha Cabal, DO 08/09/23 [phone]

## 2023-09-16 DIAGNOSIS — Z0130 Encounter for examination of blood pressure without abnormal findings: Secondary | ICD-10-CM | POA: Diagnosis not present

## 2023-09-16 DIAGNOSIS — Z1389 Encounter for screening for other disorder: Secondary | ICD-10-CM | POA: Diagnosis not present

## 2023-09-16 DIAGNOSIS — E119 Type 2 diabetes mellitus without complications: Secondary | ICD-10-CM | POA: Diagnosis not present

## 2023-09-16 DIAGNOSIS — I10 Essential (primary) hypertension: Secondary | ICD-10-CM | POA: Diagnosis not present

## 2023-09-16 DIAGNOSIS — M25473 Effusion, unspecified ankle: Secondary | ICD-10-CM | POA: Diagnosis not present

## 2023-09-16 DIAGNOSIS — Z712 Person consulting for explanation of examination or test findings: Secondary | ICD-10-CM | POA: Diagnosis not present

## 2023-11-15 DIAGNOSIS — M2550 Pain in unspecified joint: Secondary | ICD-10-CM | POA: Diagnosis not present

## 2023-12-04 ENCOUNTER — Ambulatory Visit
Admission: EM | Admit: 2023-12-04 | Discharge: 2023-12-04 | Disposition: A | Discharge status: Home / Self Care | Attending: Physician Assistant | Admitting: Physician Assistant

## 2023-12-04 ENCOUNTER — Encounter: Payer: Self-pay | Admitting: Emergency Medicine

## 2023-12-04 DIAGNOSIS — L723 Sebaceous cyst: Secondary | ICD-10-CM | POA: Diagnosis not present

## 2023-12-04 DIAGNOSIS — I10 Essential (primary) hypertension: Secondary | ICD-10-CM

## 2023-12-04 NOTE — ED Provider Notes (Signed)
 [facility]    CSN: [id number] Arrival date & time: 12/04/23  1442      History   Chief Complaint Chief Complaint  Patient presents with   Abscess    right ear    HPI Diane Logan is a 63 y.o. female with history of diabetes and hypertension. Patient presents today for cyst of right external ear for the past couple of weeks. Denies associated pain. No drainage. History of "boils."   HPI  Past Medical History:  Diagnosis Date   Anxiety    Diabetes mellitus without complication (HCC)    Headache    stress - several times per week   Heart murmur    Hypertension    Macular degeneration     Patient Active Problem List   Diagnosis Date Noted   Murmur 08/23/2018   Right bundle branch block 08/23/2018   Encounter for screening colonoscopy    Anxiety 09/17/2017   Type 2 diabetes mellitus without complication, without long-term current use of insulin (HCC) 09/17/2017   Hyperlipidemia, unspecified 09/17/2017   Hypertension 09/17/2017   Snoring 12/14/2016   OAB (overactive bladder) 12/04/2016    Past Surgical History:  Procedure Laterality Date   ABDOMINAL HYSTERECTOMY  2010   COLONOSCOPY WITH PROPOFOL  N/A 05/18/2018   Procedure: COLONOSCOPY WITH PROPOFOL ;  Surgeon: Selena Daily, MD;  Location: [facility];  Service: Endoscopy;  Laterality: N/A;  Diabetic - oral meds   OOPHORECTOMY     POLYPECTOMY N/A 05/18/2018   Procedure: POLYPECTOMY;  Surgeon: Selena Daily, MD;  Location: [facility];  Service: Endoscopy;  Laterality: N/A;    OB History   No obstetric history on file.      Home Medications    Prior to Admission medications   Medication Sig Start Date End Date Taking? Authorizing Provider  amLODipine (NORVASC) 10 MG tablet Take 10 mg by mouth daily. 07/17/22   [provider]  atorvastatin (LIPITOR) 20 MG tablet Take 20 mg by mouth daily. 04/21/21   [provider]  lisinopril (PRINIVIL,ZESTRIL) 40 MG  tablet TAKE 1 TABLET BY MOUTH EVERY DAY 08/27/11   [provider]  metFORMIN (GLUCOPHAGE) 500 MG tablet Take 500 mg by mouth.    [provider]  mirabegron  ER (MYRBETRIQ ) 25 MG TB24 tablet Take 1 tablet (25 mg total) by mouth daily. 06/15/22   Erman Hayward, MD  Multiple Vitamin (MULTIVITAMIN) tablet Take 1 tablet by mouth daily.    [provider]  Omega-3 Fatty Acids (FISH OIL PO) Take by mouth daily.    [provider]  PARoxetine (PAXIL) 40 MG tablet Take 40 mg by mouth.    [provider]    Family History Family History  Adopted: Yes    Social History Social History   Tobacco Use   Smoking status: Former   Smokeless tobacco: Never   Tobacco comments:    socially in 67s  Vaping Use   Vaping status: Never Used  Substance Use Topics   Alcohol use: Not Currently    Comment: 1 drink per month   Drug use: No     Allergies   Patient has no known allergies.   Review of Systems Review of Systems  HENT:  Negative for ear discharge and ear pain.   Skin:  Negative for color change, rash and wound.       Cyst of right external ear     Physical Exam Triage Vital Signs ED Triage Vitals  Encounter Vitals Group     BP      Systolic BP Percentile      Diastolic BP Percentile      Pulse      Resp      Temp      Temp src      SpO2      Weight      Height      Head Circumference      Peak Flow      Pain Score      Pain Loc      Pain Education      Exclude from Growth Chart    No data found.  Updated Vital Signs BP (!) 156/85 (BP Location: Right Arm)   Pulse 60   Temp 98.2 F (36.8 C) (Oral)   Resp 14   Ht 5\' 6"  (1.676 m)   Wt 216 lb 0.8 oz (98 kg)   LMP 06/03/2009 (Approximate)   SpO2 96%   BMI 34.87 kg/m      Physical Exam Vitals and nursing note reviewed.  Constitutional:      General: She is not in acute distress.    Appearance: Normal appearance. She is not ill-appearing or toxic-appearing.   HENT:     Head: Normocephalic and atraumatic.  Eyes:     General: No scleral icterus.       Right eye: No discharge.        Left eye: No discharge.     Conjunctiva/sclera: Conjunctivae normal.  Cardiovascular:     Rate and Rhythm: Normal rate.  Pulmonary:     Effort: Pulmonary effort is normal. No respiratory distress.  Musculoskeletal:     Cervical back: Neck supple.  Skin:    General: Skin is dry.     Comments: Small cyst (1 cm diameter) right external ear without tenderness, erythema or drainage  Neurological:     General: No focal deficit present.     Mental Status: She is alert. Mental status is at baseline.     Motor: No weakness.     Gait: Gait normal.  Psychiatric:        Mood and Affect: Mood normal.        Behavior: Behavior normal.      UC Treatments / Results  Labs (all labs ordered are listed, but only abnormal results are displayed) Labs Reviewed - No data to display  EKG   Radiology No results found.  Procedures Incision and Drainage  Date/Time: 12/04/2023 3:47 PM  Performed by: Floydene Hy, PA-C Authorized by: Floydene Hy, PA-C   Consent:    Consent obtained:  Verbal   Consent given by:  Patient   Risks discussed:  Bleeding, infection, incomplete drainage and pain   Alternatives discussed:  Alternative treatment, delayed treatment and observation Universal protocol:    Patient identity confirmed:  Verbally with patient Location:    Type:  Cyst   Size:  1 cm Pre-procedure details:    Skin preparation:  Chlorhexidine with alcohol Anesthesia:    Anesthesia method:  Local infiltration   Local anesthetic:  Lidocaine  1% w/o epi Procedure type:    Complexity:  Simple Procedure details:    Incision types:  Single straight   Wound management:  Probed and deloculated   Drainage:  Purulent   Drainage amount:  Moderate   Wound treatment:  Wound left open   Packing materials:  None Post-procedure details:    Procedure completion:   Tolerated  well, no immediate complications  (including critical care time)  Medications Ordered in UC Medications - No data to display  Initial Impression / Assessment and Plan / UC Course  I have reviewed the triage vital signs and the nursing notes.  Pertinent labs & imaging results that were available during my care of the patient were reviewed by me and considered in my medical decision making (see chart for details).   63 y/o female presents for cyst of right ear x 2 weeks.   BP 198/96. Re-checked with larger cuff and BP 156/85. Advised to continue amlodipine and lisinopril and keep log of BP. If consistently >140/90 to follow up with PCP.  Patient gave consent for I&D of cyst. Tolerated well. Wound left open. Discussed wound care guidelines. Reviewed return precautions.    Final Clinical Impressions(s) / UC Diagnoses   Final diagnoses:  Sebaceous cyst of ear  Essential hypertension     Discharge Instructions      -You have a cyst of your ear that was drained today -Change bandage daily. Clean with soap and water . The surgical site will likely drain a little bit until the wound closes in a few days -If cyst returns follow up with PCP or dermatology -BP elevated today. Continue meds.     ED Prescriptions   None    PDMP not reviewed this encounter.   Floydene Hy, PA-C 12/04/23 [phone]

## 2023-12-04 NOTE — Discharge Instructions (Addendum)
-  You have a cyst of your ear that was drained today -Change bandage daily. Clean with soap and water . The surgical site will likely drain a little bit until the wound closes in a few days -If cyst returns follow up with PCP or dermatology -BP elevated today. Continue meds.

## 2023-12-04 NOTE — ED Triage Notes (Signed)
 Patient reports tender bump on her right ear for the past 2 weeks.  Patient denies fevers.

## 2024-06-28 ENCOUNTER — Ambulatory Visit (INDEPENDENT_AMBULATORY_CARE_PROVIDER_SITE_OTHER)

## 2024-06-28 ENCOUNTER — Ambulatory Visit
Admission: EM | Admit: 2024-06-28 | Discharge: 2024-06-28 | Disposition: A | Discharge status: Home / Self Care | Attending: Emergency Medicine | Admitting: Emergency Medicine

## 2024-06-28 ENCOUNTER — Encounter: Payer: Self-pay | Admitting: Emergency Medicine

## 2024-06-28 DIAGNOSIS — W19XXXA Unspecified fall, initial encounter: Secondary | ICD-10-CM

## 2024-06-28 DIAGNOSIS — S0083XA Contusion of other part of head, initial encounter: Secondary | ICD-10-CM

## 2024-06-28 DIAGNOSIS — S0993XA Unspecified injury of face, initial encounter: Secondary | ICD-10-CM | POA: Diagnosis not present

## 2024-06-28 DIAGNOSIS — S01511A Laceration without foreign body of lip, initial encounter: Secondary | ICD-10-CM

## 2024-06-28 MED ORDER — AMOXICILLIN-POT CLAVULANATE 875-125 MG PO TABS: 1.0000 | ORAL_TABLET | Freq: Two times a day (BID) | ORAL | 0 refills | Status: AC

## 2024-06-28 NOTE — ED Provider Notes (Signed)
 [facility]    CSN: [id number] Arrival date & time: 06/28/24  1534      History   Chief Complaint Chief Complaint  Patient presents with   Fall    HPI Katelin Kutsch is a 63 y.o. female.   63 year old female, Lindia Garms, presents to urgent care for evaluation of lip swelling and dental issues after tripping going up the steps and hitting mouth on the concrete steps at home, pt reports lip swelling and dental pain. Pt denies LOC.  Pt took Excedrin @ 2 pm today. GCS 15  Tetanus is UTD.   The history is provided by the patient. No language interpreter was used.    Past Medical History:  Diagnosis Date   Anxiety    Diabetes mellitus without complication (HCC)    Headache    stress - several times per week   Heart murmur    Hypertension    Macular degeneration     Patient Active Problem List   Diagnosis Date Noted   Fall 06/28/2024   Contusion of face 06/28/2024   Lip laceration 06/28/2024   Pain due to dental trauma 06/28/2024   Murmur 08/23/2018   Right bundle branch block 08/23/2018   Encounter for screening colonoscopy    Anxiety 09/17/2017   Type 2 diabetes mellitus without complication, without long-term current use of insulin (HCC) 09/17/2017   Hyperlipidemia, unspecified 09/17/2017   Hypertension 09/17/2017   Snoring 12/14/2016   OAB (overactive bladder) 12/04/2016    Past Surgical History:  Procedure Laterality Date   ABDOMINAL HYSTERECTOMY  2010   COLONOSCOPY WITH PROPOFOL  N/A 05/18/2018   Procedure: COLONOSCOPY WITH PROPOFOL ;  Surgeon: Unk Corinn Skiff, MD;  Location: [facility];  Service: Endoscopy;  Laterality: N/A;  Diabetic - oral meds   OOPHORECTOMY     POLYPECTOMY N/A 05/18/2018   Procedure: POLYPECTOMY;  Surgeon: Unk Corinn Skiff, MD;  Location: [facility];  Service: Endoscopy;  Laterality: N/A;    OB History   No obstetric history on file.      Home Medications    Prior to Admission medications    Medication Sig Start Date End Date Taking? Authorizing Provider  amoxicillin-clavulanate (AUGMENTIN) 875-125 MG tablet Take 1 tablet by mouth every 12 (twelve) hours. 06/28/24  Yes Nichael Ehly, NP  amLODipine (NORVASC) 10 MG tablet Take 10 mg by mouth daily. 07/17/22   [provider]  atorvastatin (LIPITOR) 20 MG tablet Take 20 mg by mouth daily. 04/21/21   [provider]  lisinopril (PRINIVIL,ZESTRIL) 40 MG tablet TAKE 1 TABLET BY MOUTH EVERY DAY 08/27/11   [provider]  metFORMIN (GLUCOPHAGE) 500 MG tablet Take 500 mg by mouth.    [provider]  mirabegron  ER (MYRBETRIQ ) 25 MG TB24 tablet Take 1 tablet (25 mg total) by mouth daily. 06/15/22   Gaston Hamilton, MD  Multiple Vitamin (MULTIVITAMIN) tablet Take 1 tablet by mouth daily.    [provider]  Omega-3 Fatty Acids (FISH OIL PO) Take by mouth daily.    [provider]  PARoxetine (PAXIL) 40 MG tablet Take 40 mg by mouth.    [provider]    Family History Family History  Adopted: Yes    Social History Social History   Tobacco Use   Smoking status: Former   Smokeless tobacco: Never   Tobacco comments:    socially in 46s  Vaping Use   Vaping status: Never Used  Substance Use Topics  Alcohol use: Not Currently    Comment: 1 drink per month   Drug use: No     Allergies   Patient has no known allergies.   Review of Systems Review of Systems  Constitutional:  Negative for fever.  HENT:  Positive for dental problem and facial swelling.   Skin:  Positive for color change and wound.  All other systems reviewed and are negative.    Physical Exam Triage Vital Signs ED Triage Vitals  Encounter Vitals Group     BP --      Girls Systolic BP Percentile --      Girls Diastolic BP Percentile --      Boys Systolic BP Percentile --      Boys Diastolic BP Percentile --      Pulse --      Resp --      Temp --      Temp src --      SpO2 --       Weight 06/28/24 1611 212 lb (96.2 kg)     Height --      Head Circumference --      Peak Flow --      Pain Score 06/28/24 1613 9     Pain Loc --      Pain Education --      Exclude from Growth Chart --    No data found.  Updated Vital Signs BP (!) 144/86 (BP Location: Left Arm)   Pulse 78   Temp 98.4 F (36.9 C) (Oral)   Resp 18   Wt 212 lb (96.2 kg)   LMP 06/03/2009 (Approximate)   SpO2 97%   BMI 34.22 kg/m   Visual Acuity Right Eye Distance:   Left Eye Distance:   Bilateral Distance:    Right Eye Near:   Left Eye Near:    Bilateral Near:     Physical Exam Vitals and nursing note reviewed.  Constitutional:      Appearance: Normal appearance. She is well-developed and well-groomed.  HENT:     Head:      Mouth/Throat:     Lips: Pink.     Mouth: Mucous membranes are moist.     Dentition: Dental tenderness present.      Comments: Dental pain, no loose teeth, +TTP Cardiovascular:     Rate and Rhythm: Normal rate.  Pulmonary:     Effort: Pulmonary effort is normal.  Neurological:     General: No focal deficit present.     Mental Status: She is alert and oriented to person, place, and time.     GCS: GCS eye subscore is 4. GCS verbal subscore is 5. GCS motor subscore is 6.  Psychiatric:        Attention and Perception: Attention normal.        Mood and Affect: Mood normal.        Speech: Speech normal.        Behavior: Behavior normal. Behavior is cooperative.      UC Treatments / Results  Labs (all labs ordered are listed, but only abnormal results are displayed) Labs Reviewed - No data to display  EKG   Radiology DG Facial Bones Complete Result Date: 06/28/2024 CLINICAL DATA:  Status post fall. EXAM: FACIAL BONES COMPLETE 3+V COMPARISON:  None Available. FINDINGS: There is no evidence of fracture or other significant bone abnormality. No orbital emphysema or sinus air-fluid levels are seen. IMPRESSION: Negative. Electronically Signed   By:  Suzen Dials M.D.   On: 06/28/2024 17:22    Procedures Procedures (including critical care time)  Medications Ordered in UC Medications - No data to display  Initial Impression / Assessment and Plan / UC Course  I have reviewed the triage vital signs and the nursing notes.  Pertinent labs & imaging results that were available during my care of the patient were reviewed by me and considered in my medical decision making (see chart for details).     Ddx: Fall, chin contusion, lip laceration, dental injury, fracture,dislocation Final Clinical Impressions(s) / UC Diagnoses   Final diagnoses:  Fall, initial encounter  Contusion of face, initial encounter  Lip laceration, initial encounter  Pain due to dental trauma     Discharge Instructions      Your xrays were negative for fracture. Your wounds should heal quickly. May apply topical antibiotic ointment to face as label directed.  Keep wounds clean and dry.  Take augmentin as prescribed. Apply ice to face/chin May take tylenol as label directed for pain. Follow up with dentist-call tomorrow      ED Prescriptions     Medication Sig Dispense Auth. Provider   amoxicillin-clavulanate (AUGMENTIN) 875-125 MG tablet Take 1 tablet by mouth every 12 (twelve) hours. 14 tablet Ananda Caya, Rilla, NP      PDMP not reviewed this encounter.   Aminta Rilla, NP 06/28/24 [phone]

## 2024-06-28 NOTE — ED Triage Notes (Signed)
 Pt tripped going up the steps and hit her mouth on the steps. She has lip swelling and teeth pain.

## 2024-06-28 NOTE — Discharge Instructions (Addendum)
 Your xrays were negative for fracture. Your wounds should heal quickly. May apply topical antibiotic ointment to face as label directed.  Keep wounds clean and dry.  Take augmentin  as prescribed. Apply ice to face/chin May take tylenol as label directed for pain. Follow up with dentist-call tomorrow

## 2024-07-08 ENCOUNTER — Encounter: Payer: Self-pay | Admitting: Emergency Medicine

## 2024-07-08 ENCOUNTER — Ambulatory Visit
Admission: EM | Admit: 2024-07-08 | Discharge: 2024-07-08 | Disposition: A | Discharge status: Home / Self Care | Attending: Emergency Medicine | Admitting: Emergency Medicine

## 2024-07-08 DIAGNOSIS — T792XXA Traumatic secondary and recurrent hemorrhage and seroma, initial encounter: Secondary | ICD-10-CM

## 2024-07-08 NOTE — ED Provider Notes (Signed)
 [facility]    CSN: [id number] Arrival date & time: 07/08/24  1430      History   Chief Complaint Chief Complaint  Patient presents with   Facial Pain    Chin    HPI Diane Logan is a 63 y.o. female.   HPI  63 year old female with past medical history significant for anxiety, diabetes, stress headaches, heart murmur, hypertension, and macular degeneration presents for evaluation of ongoing chin pain and swelling after suffering a ground-level fall 10 days ago.  She has followed up with her dentist.  Past Medical History:  Diagnosis Date   Anxiety    Diabetes mellitus without complication (HCC)    Headache    stress - several times per week   Heart murmur    Hypertension    Macular degeneration     Patient Active Problem List   Diagnosis Date Noted   Fall 06/28/2024   Contusion of face 06/28/2024   Lip laceration 06/28/2024   Pain due to dental trauma 06/28/2024   Murmur 08/23/2018   Right bundle branch block 08/23/2018   Encounter for screening colonoscopy    Anxiety 09/17/2017   Type 2 diabetes mellitus without complication, without long-term current use of insulin (HCC) 09/17/2017   Hyperlipidemia, unspecified 09/17/2017   Hypertension 09/17/2017   Snoring 12/14/2016   OAB (overactive bladder) 12/04/2016    Past Surgical History:  Procedure Laterality Date   ABDOMINAL HYSTERECTOMY  2010   COLONOSCOPY WITH PROPOFOL  N/A 05/18/2018   Procedure: COLONOSCOPY WITH PROPOFOL ;  Surgeon: Unk Corinn Skiff, MD;  Location: [facility];  Service: Endoscopy;  Laterality: N/A;  Diabetic - oral meds   OOPHORECTOMY     POLYPECTOMY N/A 05/18/2018   Procedure: POLYPECTOMY;  Surgeon: Unk Corinn Skiff, MD;  Location: [facility];  Service: Endoscopy;  Laterality: N/A;    OB History   No obstetric history on file.      Home Medications    Prior to Admission medications   Medication Sig Start Date End Date Taking? Authorizing Provider   amLODipine (NORVASC) 10 MG tablet Take 10 mg by mouth daily. 07/17/22   [provider]  amoxicillin -clavulanate (AUGMENTIN ) 875-125 MG tablet Take 1 tablet by mouth every 12 (twelve) hours. 06/28/24   Defelice, Jeanette, NP  atorvastatin (LIPITOR) 20 MG tablet Take 20 mg by mouth daily. 04/21/21   [provider]  lisinopril (PRINIVIL,ZESTRIL) 40 MG tablet TAKE 1 TABLET BY MOUTH EVERY DAY 08/27/11   [provider]  metFORMIN (GLUCOPHAGE) 500 MG tablet Take 500 mg by mouth.    [provider]  mirabegron  ER (MYRBETRIQ ) 25 MG TB24 tablet Take 1 tablet (25 mg total) by mouth daily. 06/15/22   Gaston Hamilton, MD  Multiple Vitamin (MULTIVITAMIN) tablet Take 1 tablet by mouth daily.    [provider]  Omega-3 Fatty Acids (FISH OIL PO) Take by mouth daily.    [provider]  PARoxetine (PAXIL) 40 MG tablet Take 40 mg by mouth.    [provider]    Family History Family History  Adopted: Yes    Social History Social History   Tobacco Use   Smoking status: Former   Smokeless tobacco: Never   Tobacco comments:    socially in 16s  Vaping Use   Vaping status: Never Used  Substance Use Topics   Alcohol use: Not Currently    Comment: 1 drink per month   Drug use: No     Allergies  Patient has no known allergies.   Review of Systems Review of Systems  HENT:  Positive for facial swelling.   Skin:  Positive for color change.     Physical Exam Triage Vital Signs ED Triage Vitals  Encounter Vitals Group     BP 07/08/24 1510 136/76     Girls Systolic BP Percentile --      Girls Diastolic BP Percentile --      Boys Systolic BP Percentile --      Boys Diastolic BP Percentile --      Pulse Rate 07/08/24 1510 64     Resp 07/08/24 1510 15     Temp 07/08/24 1510 98.1 F (36.7 C)     Temp Source 07/08/24 1510 Oral     SpO2 07/08/24 1510 97 %     Weight 07/08/24 1508 212 lb 1.3 oz (96.2 kg)     Height 07/08/24 1508  5' 6 (1.676 m)     Head Circumference --      Peak Flow --      Pain Score 07/08/24 1508 5     Pain Loc --      Pain Education --      Exclude from Growth Chart --    No data found.  Updated Vital Signs BP 136/76 (BP Location: Left Arm)   Pulse 64   Temp 98.1 F (36.7 C) (Oral)   Resp 15   Ht 5' 6 (1.676 m)   Wt 212 lb 1.3 oz (96.2 kg)   LMP 06/03/2009 (Approximate)   SpO2 97%   BMI 34.23 kg/m   Visual Acuity Right Eye Distance:   Left Eye Distance:   Bilateral Distance:    Right Eye Near:   Left Eye Near:    Bilateral Near:     Physical Exam Vitals and nursing note reviewed.  Constitutional:      Appearance: Normal appearance. She is not ill-appearing.  HENT:     Head: Normocephalic and atraumatic.  Skin:    General: Skin is warm and dry.     Capillary Refill: Capillary refill takes less than 2 seconds.     Findings: Bruising present.  Neurological:     General: No focal deficit present.     Mental Status: She is alert and oriented to person, place, and time.      UC Treatments / Results  Labs (all labs ordered are listed, but only abnormal results are displayed) Labs Reviewed - No data to display  EKG   Radiology No results found.  Procedures Procedures (including critical care time)  Medications Ordered in UC Medications - No data to display  Initial Impression / Assessment and Plan / UC Course  I have reviewed the triage vital signs and the nursing notes.  Pertinent labs & imaging results that were available during my care of the patient were reviewed by me and considered in my medical decision making (see chart for details).   Patient is a pleasant, nontoxic-appearing 63 year old female presenting for evaluation of ongoing discoloration and tenderness to her chin after suffering a ground-level fall 10 days ago.  At that time she had x-rays of her facial bones performed which were negative for fracture.  She did follow-up with her dentist  who performed a Panorex and confirmed no dental injury or mandible fracture.  She reports that she continues to have some swelling of her chin and she feels like the skin is tight and it is also itching.  She  is able to eat but reports that putting pressure on her teeth causes discomfort.  As you can see in image above, there is continued discoloration to the right lateral aspect of the chin as well as prominent swelling.  There is well-defined area of fluctuance under the skin.  It is not hot or tender to touch.  I suspect that patient may be developing a seroma.  I have advised her that we do not drain facial abscesses or seromas in urgent care.  I can make a referral to ENT and also to plastic surgery for evaluation and possible draining.   Final Clinical Impressions(s) / UC Diagnoses   Final diagnoses:  Seroma, post-traumatic     Discharge Instructions      I have referred you to the [facility] health plastic surgery specialist.  They will contact you to make an appointment.  You may apply warm compresses to your chin for 20 days at a time, 2-3 times a day, to see if you can get the fluid to absorb.  You may use over-the-counter Tylenol and/or ibuprofen  according to the package instructions as needed for pain.  If developing new or worsening symptoms please return for reevaluation or see your primary care provider.     ED Prescriptions   None    PDMP not reviewed this encounter.   Bernardino Ditch, NP 07/08/24 1526

## 2024-07-08 NOTE — ED Triage Notes (Signed)
 Patient was seen here a week ago for a fall and chin injury.  Patient reports ongoing pain, swelling and redness in her chin.

## 2024-07-08 NOTE — Discharge Instructions (Addendum)
 I have referred you to the [facility] health plastic surgery specialist.  They will contact you to make an appointment.  You may apply warm compresses to your chin for 20 days at a time, 2-3 times a day, to see if you can get the fluid to absorb.  You may use over-the-counter Tylenol and/or ibuprofen  according to the package instructions as needed for pain.  If developing new or worsening symptoms please return for reevaluation or see your primary care provider.

## 2024-07-25 ENCOUNTER — Other Ambulatory Visit: Payer: Self-pay | Admitting: Family Medicine

## 2024-07-25 DIAGNOSIS — L989 Disorder of the skin and subcutaneous tissue, unspecified: Secondary | ICD-10-CM

## 2024-08-30 ENCOUNTER — Ambulatory Visit
Admission: RE | Admit: 2024-08-30 | Discharge: 2024-08-30 | Disposition: A | Discharge status: Home / Self Care | Source: Ambulatory Visit | Attending: Family Medicine | Admitting: Family Medicine

## 2024-08-30 DIAGNOSIS — L989 Disorder of the skin and subcutaneous tissue, unspecified: Secondary | ICD-10-CM | POA: Insufficient documentation
